# Patient Record
Sex: Female | Born: 1950 | Race: White | Hispanic: No | Marital: Married | Smoking: Former smoker
Health system: Southern US, Community
[De-identification: ages and names within clinical notes are randomized; demographics above are authoritative.]

## PROBLEM LIST (undated history)

## (undated) DIAGNOSIS — E669 Obesity, unspecified: Secondary | ICD-10-CM

## (undated) DIAGNOSIS — E785 Hyperlipidemia, unspecified: Secondary | ICD-10-CM

## (undated) DIAGNOSIS — G2581 Restless legs syndrome: Secondary | ICD-10-CM

## (undated) DIAGNOSIS — T7840XA Allergy, unspecified, initial encounter: Secondary | ICD-10-CM

## (undated) HISTORY — PX: ABDOMINAL HYSTERECTOMY: SHX81

## (undated) HISTORY — DX: Restless legs syndrome: G25.81

## (undated) HISTORY — DX: Allergy, unspecified, initial encounter: T78.40XA

## (undated) HISTORY — DX: Obesity, unspecified: E66.9

## (undated) HISTORY — DX: Hyperlipidemia, unspecified: E78.5

## (undated) HISTORY — PX: CHOLECYSTECTOMY: SHX55

---

## 2001-05-25 ENCOUNTER — Encounter: Payer: Self-pay | Admitting: Family Medicine

## 2001-05-25 ENCOUNTER — Encounter: Admission: RE | Admit: 2001-05-25 | Discharge: 2001-05-25 | Payer: Self-pay | Admitting: Family Medicine

## 2001-11-03 ENCOUNTER — Encounter: Payer: Self-pay | Admitting: General Surgery

## 2001-11-03 ENCOUNTER — Encounter (INDEPENDENT_AMBULATORY_CARE_PROVIDER_SITE_OTHER): Payer: Self-pay | Admitting: *Deleted

## 2001-11-03 ENCOUNTER — Ambulatory Visit (HOSPITAL_COMMUNITY): Admission: RE | Admit: 2001-11-03 | Discharge: 2001-11-04 | Payer: Self-pay | Admitting: General Surgery

## 2002-11-01 ENCOUNTER — Other Ambulatory Visit: Admission: RE | Admit: 2002-11-01 | Discharge: 2002-11-01 | Payer: Self-pay | Admitting: Family Medicine

## 2003-03-29 ENCOUNTER — Ambulatory Visit (HOSPITAL_COMMUNITY): Admission: RE | Admit: 2003-03-29 | Discharge: 2003-03-29 | Payer: Self-pay | Admitting: Gastroenterology

## 2003-04-03 ENCOUNTER — Ambulatory Visit (HOSPITAL_COMMUNITY): Admission: RE | Admit: 2003-04-03 | Discharge: 2003-04-03 | Payer: Self-pay | Admitting: Gastroenterology

## 2003-04-03 ENCOUNTER — Encounter: Payer: Self-pay | Admitting: Gastroenterology

## 2003-10-15 ENCOUNTER — Encounter: Admission: RE | Admit: 2003-10-15 | Discharge: 2003-10-15 | Payer: Self-pay | Admitting: Family Medicine

## 2005-05-25 ENCOUNTER — Encounter: Admission: RE | Admit: 2005-05-25 | Discharge: 2005-05-25 | Payer: Self-pay | Admitting: Family Medicine

## 2006-08-01 ENCOUNTER — Encounter: Admission: RE | Admit: 2006-08-01 | Discharge: 2006-08-01 | Payer: Self-pay | Admitting: Family Medicine

## 2007-07-25 ENCOUNTER — Encounter: Admission: RE | Admit: 2007-07-25 | Discharge: 2007-07-25 | Payer: Self-pay | Admitting: Family Medicine

## 2007-08-23 ENCOUNTER — Encounter (INDEPENDENT_AMBULATORY_CARE_PROVIDER_SITE_OTHER): Payer: Self-pay | Admitting: Obstetrics and Gynecology

## 2007-08-23 ENCOUNTER — Ambulatory Visit (HOSPITAL_COMMUNITY): Admission: RE | Admit: 2007-08-23 | Discharge: 2007-08-23 | Payer: Self-pay | Admitting: Obstetrics and Gynecology

## 2010-01-26 ENCOUNTER — Encounter: Admission: RE | Admit: 2010-01-26 | Discharge: 2010-01-26 | Payer: Self-pay | Admitting: Family Medicine

## 2010-03-31 ENCOUNTER — Encounter: Admission: RE | Admit: 2010-03-31 | Discharge: 2010-03-31 | Payer: Self-pay | Admitting: Family Medicine

## 2010-10-11 ENCOUNTER — Encounter: Payer: Self-pay | Admitting: Family Medicine

## 2011-02-02 NOTE — H&P (Signed)
Margaret Casey, KAUFMAN                ACCOUNT NO.:  [id number]   MEDICAL RECORD NO.:  [id number]          PATIENT TYPE:  AMB   LOCATION:  SDC                           [facility]:  [facility]   PHYSICIAN:  Lenoard Aden, M.D.DATE OF BIRTH:  April 16, 1951   DATE OF ADMISSION:  08/23/2007  DATE OF DISCHARGE:  08/23/2007                              HISTORY & PHYSICAL   CHIEF COMPLAINT:  Pelvic pain, ovarian cyst.   HISTORY:  She is a 60 year old white female G 3, P 3, status post  hysterectomy in 1989, who presents with new onset ovarian cyst and  persistent pelvic pain, for definitive therapy.  She had an ultrasound  performed on August 03, 2007, consistent with a 4 cm ovarian cyst in  the left ovary with septation noted.  She had a normal CA125, normal  right ovary, absent uterus.  She presents now for definitive therapy,  due to persistent pain and discomfort.   CURRENT MEDICATIONS:  1. Estradiol.  2. Singulair.   ALLERGIES:  LATEX, PREDNISONE AND PREMARIN, as previously reported.   FAMILY HISTORY:  Diabetes, heart disease and chronic hypertension.   PAST SURGICAL HISTORY:  1. Remarkable for a cholecystectomy in 2002.  2. Hysterectomy in 1989.   SOCIAL HISTORY:  She is a nonsmoker, nondrinker.  She denies significant  physical violence.   PHYSICAL EXAMINATION:  VITAL SIGNS:  Blood pressure 114/72, weight 159  pounds.  HEENT:  Normal.  LUNGS:  Clear.  HEART:  A regular rhythm.  ABDOMEN:  Soft, nontender.  PELVIC EXAM:  Reveals an absent uterus and pelvic fullness noted, and  tenderness.  No nodularity in the cul-de-sac is noted.  EXTREMITIES:  No cords.  NEUROLOGIC:  Nonfocal.  SKIN:  Intact.   IMPRESSION:  Septated ovarian cyst, for surgical therapy.   PLAN:  Proceed with a diagnostic laparoscopy and bilateral salpingo-  oophorectomy.  The risks of anesthesia, infection, bleeding and intra-  abdominal injury were discussed.  The numerous complications including  bowel and  bladder injury were noted, the inability to cure cancer were  discussed.  The patient acknowledges and wishes to proceed.      Lenoard Aden, M.D.  Electronically Signed     RJT/MEDQ  D:  08/22/2007  T:  08/23/2007  Job:  [id number]

## 2011-02-02 NOTE — Op Note (Signed)
NAMECECI, TALIAFERRO NO.:  [id number]   MEDICAL RECORD NO.:  [id number]          PATIENT TYPE:  INP   LOCATION:  9312                          [facility]ILITY:  [facility]   PHYSICIAN:  Lenoard Aden, M.D.DATE OF BIRTH:  11/08/1950   DATE OF PROCEDURE:  08/23/2007  DATE OF DISCHARGE:                               OPERATIVE REPORT   PREOPERATIVE DIAGNOSES:  1. Pelvic pain.  2. Left ovarian cyst.   POSTOPERATIVE DIAGNOSES:  1. Left paratubal cyst.  2. Pelvic adhesions.   PROCEDURES:  1. Diagnostic laparoscopy.  2. Bilateral salpingo-oophorectomy.  3. Lysis of adhesions.   SURGEON:  Lenoard Aden, M.D.   ASSISTANT:  Chester Holstein. Earlene Plater, M.D.   ANESTHESIA:  General.   ESTIMATED BLOOD LOSS:  Less than 50 mL.   COMPLICATIONS:  None.   DRAINS:  Foley.   COUNTS:  Correct.   Patient to recovery in good condition.   SPECIMENS:  Left paratubal cyst, left ovary and left tube, right tube  and ovary to pathology.   BRIEF OPERATIVE NOTE:  After being apprised of the risks of anesthesia,  infection, bleeding, injury to abdominal organs and need for repair,  delayed versus complications to include bowel and bladder injury, the  patient was brought to the operating.  She was administered a general  anesthetic without complications, prepped and draped in the usual  fashion.  She was placed in the Milton stirrups, a Foley catheter placed,  a sponge forceps placed per vagina.  Infraumbilical incision made with a  scalpel and the fascia identified.  Fascia opened transversely,  peritoneum identified and entered sharply using Metzenbaum scissors.  Atraumatic entry noted.  A pursestring suture placed in the fascia using  0 Vicryl suture.  Hasson trocar placed.  Visualization reveals normal  liver and gallbladder bed, normal diaphragmatic area.  There is an  absent uterus, adhesions in the cul-de-sac.  The left paratubal cyst and  ovary are adhesed to the left pelvic  sidewall.  Right tube and ovary  appear otherwise normal and are adhered to the left sigmoid mesentery.  At this time to 5-mm trocar sites are placed bilaterally under direct  visualization and Endoshears are placed and the Gyrus is used to excise  the left paratubal cyst from the pelvic sidewall without difficulty.  It  is placed in the cul-de-sac intact.  The left infundibulopelvic ligament  is then identified after identifying the left ureter and is cauterized  using the gyrus after lysing adhesions to the pelvic sidewall.  Using  sharp dissection, the left ovary is then removed intact and placed in  the cul-de-sac.  The cul-de-sac adhesions are then lysed sharply using  Endoshears and sharp dissection in the cul-de-sac after identifying the  course of the bowel throughout the entire process.  At this time the  adhesions having been removed and right ureter is identified, the right  infundibulopelvic ligament is identified, grasped and ligated using the  Gyrus with progressive bites down the right mesosalpingeal area is used  to remove the entire right ovarian-tubal complex.  Good hemostasis  noted.  The 5-mm camera is placed, an EndoCatch is placed, and all  specimens are placed in the EndoCatch and removed through the abdominal  incision.  At this time good is hemostasis noted, irrigation was  accomplished and the ureters appear normal bilaterally.  At this time  all instruments are removed under direct visualization, CO2 was  released.  The incision is closed using 0 Vicryl and Dermabond.  Instruments are removed from the vagina.  The patient tolerates the  procedure well and is transferred to recovery in good condition.      Lenoard Aden, M.D.  Electronically Signed     RJT/MEDQ  D:  08/23/2007  T:  08/24/2007  Job:  [id number]

## 2011-02-05 NOTE — Op Note (Signed)
   NAME:  Margaret Casey, Margaret Casey                          ACCOUNT NO.:  [id number]   MEDICAL RECORD NO.:  [id number]                   PATIENT TYPE:  AMB   LOCATION:  ENDO                                 FACILITY:  [facility]   PHYSICIAN:  Anselmo Rod, M.D.               DATE OF BIRTH:  [birth year]   DATE OF PROCEDURE:  03/29/2003  DATE OF DISCHARGE:                                 OPERATIVE REPORT   PROCEDURE:  Colonoscopy.   ENDOSCOPIST:  Anselmo Rod, M.D.   INSTRUMENT USED:  Olympus video colonoscope.   INDICATIONS FOR PROCEDURE:  A 60 year old white female with a history of  rectal bleeding rule out colonic polyps, masses, hemorrhoids, etc.   PREPROCEDURE PREPARATION:  Informed consent was obtained from the patient  and the patient was  fasted for 8 hours prior to  the procedure and prepped  with a bottle of magnesium citrate and a gallon of GoLYTELY the night prior  to the procedure.   PREPROCEDURE PHYSICAL:  The patient had stable vital signs. Neck supple.  Chest clear to auscultation, normal S1, S2. Abdomen soft with normoactive  bowel sounds.   DESCRIPTION OF PROCEDURE:  The patient was placed in the left lateral  decubitus position and sedated with an additional 10 mg of  Demerol and 1 mg  of Versed intravenously. Once sedation was adequate, the patient was  maintained on low flow oxygen and continuous cardiac monitoring.   The Olympus video colonoscope was advanced into the rectum to the cecum  without difficulty. There was some residual  stool  in the colon. Multiple  washings were done. No masses, polyps, erosions or ulcerations or  diverticula were seen.  The appendiceal orifice and ileocecal valve were  clearly visualized and photographed. No source of bleeding could be  identified.  Retroflexion in the rectum revealed no abnormalities as well.   IMPRESSION:  Normal colonoscopy.   RECOMMENDATIONS:  1. A high fiber diet with liberal fluid intake has been  recommended.  2.     Avoidance of all nonsteroidals including aspirin has been encouraged.  3. A small bowel follow through will be done to complete the patient's GI     evaluation. Further  recommendations will be made as needed.                                               Anselmo Rod, M.D.    JNM/MEDQ  D:  03/29/2003  T:  03/30/2003  Job:  [id number]   cc:   Talmadge Coventry, M.D.  [address]  [place]  [place] [address]  Fax: [phone]

## 2011-02-05 NOTE — Op Note (Signed)
   NAME:  Margaret Casey, Margaret Casey                          ACCOUNT NO.:  [id number]   MEDICAL RECORD NO.:  [id number]                   PATIENT TYPE:  AMB   LOCATION:  ENDO                                 FACILITY:  [facility]   PHYSICIAN:  Anselmo Rod, M.D.               DATE OF BIRTH:  10/25/1950   DATE OF PROCEDURE:  03/29/2003  DATE OF DISCHARGE:                                 OPERATIVE REPORT   PROCEDURE:  Esophagogastroduodenoscopy.   ENDOSCOPIST:  Anselmo Rod, M.D.   INSTRUMENT USED:  Olympus video panendoscope.   INDICATIONS FOR PROCEDURE:  Question melena in a 60 year old white female  with a history of  melena with bright red blood in the stool. Rule out  peptic ulcer disease, esophagitis, gastritis, etc.   PREPROCEDURE PREPARATION:  Informed consent was obtained from the patient.  The patient was  fasted for 8 hours prior to  the procedure.   PREPROCEDURE PHYSICAL:  The patient had stable vital signs, neck supple,  chest clear to auscultation, S1, S2, regular, abdomen soft with normal bowel  sounds.   DESCRIPTION OF PROCEDURE:  The patient was placed in the left lateral  decubitus position and sedated with 90 mg of Demerol, and 9 mg of Versed  intravenously. Once the patient was adequately sedated and maintained on low  flow oxygen and continuous cardiac monitoring, the Olympus video  panendoscope was advanced through the mouth piece over the tongue into the  esophagus under direct visualization.   The entire esophagus appeared normal with no evidence of rings, stricture,  masses, esophagitis or Barrett's mucosa. The scope was then advanced into  the stomach. There was mild diffuse gastritis noted throughout the gastric  mucosa. No ulcerations, masses or polyps were seen. The proximal small bowel  appeared  normal. There was no obvious obstruction. The patient tolerated  the procedure well without complications. No source of bleeding could be  identified.   IMPRESSION:   Mild diffuse gastritis, otherwise normal  esophagogastroduodenoscopy, no ulcers, erosions, masses or polyps seen.   RECOMMENDATIONS:  1. Proceed with the colonoscopy at this time.  2.     Avoid nonsteroidals.  3. Continue proton pump inhibitors.  4. Further  recommendations will be made after the colonoscopy has been     done.                                               Anselmo Rod, M.D.    JNM/MEDQ  D:  03/29/2003  T:  03/30/2003  Job:  [id number]   cc:   Talmadge Coventry, M.D.  [address]  [place]  [place] [address]  Fax: [phone]

## 2011-02-05 NOTE — Op Note (Signed)
[facility]  Patient:    Margaret Casey, Margaret Casey Visit Number: [id number] MRN: [id number]          Service Type: DSU Location: [phone] Attending Physician:  Delsa Bern Dictated by:   Lorne Skeens. Hoxworth, M.D. Proc. Date: 11/03/01 Admit Date:  11/03/2001 Discharge Date: 11/04/2001                             Operative Report  PREOPERATIVE DIAGNOSIS:  Symptomatic cholelithiasis.  POSTOPERATIVE DIAGNOSIS:  Symptomatic cholelithiasis.  PROCEDURE:  Laparoscopic cholecystectomy.  SURGEON:  Lorne Skeens. Hoxworth, M.D.  ASSISTANT:  Sandria Bales. Ezzard Standing, M.D.  ANESTHESIA:  General.  BRIEF HISTORY:  Ms. Printup is a 60 year old white female with a two-week history of fairly persistent epigastric abdominal pain following an acute episode of severe pain.  She has had a workup including a gallbladder ultrasound showing multiple gallstones.  Tenderness in the epigastrium and right upper quadrant has been noted.  She is felt to have symptomatic cholelithiasis, and laparoscopic cholecystectomy has been recommended and accepted.  The nature of the procedure, its indications, and risks of bleeding, infection, bile duct injury were discussed and understood preoperatively.  She is now brought to the operating room for this procedure.  DESCRIPTION OF PROCEDURE:  The patient was brought to the operating room and placed in the supine position on the operating table, and general endotracheal anesthesia was induced.  The abdomen was sterilely prepped and draped.  PAS were in place.  She received preoperative antibiotics.  Local anesthesia was used to infiltrate the trocar sites.  A 1 cm incision was made in the umbilicus and dissection carried down to the midline fascia, which was sharply incised for 1 cm and the peritoneum entered under direct vision.  Through a mattress suture of 0 Vicryl, the Hasson trocar was placed and pneumoperitoneum established.   Under direct vision a 10 mm trocar was placed in the subxiphoid area and two 5 mm trocars in the right subcostal margin.  The gallbladder was somewhat distended and slightly thickened.  The fundus was grasped and elevated up over the liver and the infundibulum retracted inferolaterally. The peritoneum on the anterior and posterior side of the distal gallbladder was incised, and fibrofatty tissue was stripped off the neck of the gallbladder toward the porta hepatis.  The distal gallbladder and Calots triangle was thoroughly dissected.  The cystic duct was identified and dissected 360 degrees.  The anatomy was clear.  The common bile duct was normal by ultrasound, and LFTs were normal.  I elected not to perform a cholangiogram.  The cystic duct and cystic artery were clearly visualized, were then doubly clipped proximally, clipped distally, and divided.  The gallbladder was then dissected free from its bed using hook cautery.  A couple of small accessory vessels in the gallbladder bed were clipped.  The gallbladder was then removed intact through the umbilicus.  There was no bleeding.  We carefully inspected the operative site.  Trocars were removed and all CO2 evacuated from the peritoneal cavity.  The mattress suture was secured to the umbilicus.  The skin incisions were closed with interrupted subcuticular 4-0 Monocryl and Steri-Strips.  The sponge, instrument, and needle counts were correct.  Dry sterile dressings were applied, and the patient was taken to recovery in good condition. Dictated by:   Lorne Skeens. Hoxworth, M.D. Attending Physician:  Delsa Bern DD:  11/03/01 TD:  11/04/01 Job: [id number]

## 2011-06-28 LAB — CBC
HCT: 42.9
MCHC: 34.5
MCV: 91.1
Platelets: 298
WBC: 5.9

## 2012-01-18 ENCOUNTER — Encounter: Payer: Self-pay | Admitting: *Deleted

## 2012-01-18 DIAGNOSIS — G2581 Restless legs syndrome: Secondary | ICD-10-CM | POA: Insufficient documentation

## 2012-01-18 DIAGNOSIS — E669 Obesity, unspecified: Secondary | ICD-10-CM | POA: Insufficient documentation

## 2012-01-18 DIAGNOSIS — M109 Gout, unspecified: Secondary | ICD-10-CM | POA: Insufficient documentation

## 2012-02-26 ENCOUNTER — Ambulatory Visit (INDEPENDENT_AMBULATORY_CARE_PROVIDER_SITE_OTHER): Payer: BC Managed Care – PPO | Admitting: Internal Medicine

## 2012-02-26 VITALS — BP 121/75 | HR 66 | Temp 98.0°F | Resp 16 | Ht 64.0 in | Wt 199.4 lb

## 2012-02-26 DIAGNOSIS — J019 Acute sinusitis, unspecified: Secondary | ICD-10-CM

## 2012-02-26 DIAGNOSIS — J301 Allergic rhinitis due to pollen: Secondary | ICD-10-CM

## 2012-02-26 MED ORDER — LORATADINE 10 MG PO CAPS: 10.0000 mg | ORAL_CAPSULE | ORAL | Status: DC

## 2012-02-26 MED ORDER — AZELASTINE HCL 0.15 % NA SOLN: 2.0000 | Freq: Two times a day (BID) | NASAL | Status: DC

## 2012-02-26 MED ORDER — AMOXICILLIN 500 MG PO CAPS: 1000.0000 mg | ORAL_CAPSULE | Freq: Two times a day (BID) | ORAL | Status: AC

## 2012-02-26 NOTE — Progress Notes (Signed)
  Subjective:    Patient ID: Margaret Casey, female    DOB: [birth year], 61 y.o.   MRN: [id number]  HPIYear-round allergies to mold Seasonal allergies to pollen Now with symptoms for 6-8 weeks/OTC meds not working 2 weeks of purulent sputum with slight cough but only during the daytime Sinus pressure    Review of Systems Patient Active Problem List  Diagnoses  . Gout  . Obesity  . Restless leg syndrome  Stable problems      Objective:   Physical Exam Conjunctiva slightly injected Nares boggy with purulent mucus Throat clear/no lymph node swelling Chest clear to auscultation       Assessment & Plan:  Problem #1 allergic rhinitis Problem #2 sinusitis secondary Meds ordered this encounter  Medications  . Loratadine 10 MG CAPS    Sig: Take 1 capsule (10 mg total) by mouth 1 day or 1 dose.    Dispense:  30 each    Refill:  11  . amoxicillin (AMOXIL) 500 MG capsule    Sig: Take 2 capsules (1,000 mg total) by mouth 2 (two) times daily.    Dispense:  40 capsule    Refill:  0  . Azelastine HCl 0.15 % SOLN    Sig: Place 2 sprays into the nose 2 (two) times daily.    Dispense:  30 mL    Refill:  11  Followup 10 days if not well Continue Nose spray as prophylaxis

## 2012-04-05 ENCOUNTER — Other Ambulatory Visit: Payer: Self-pay | Admitting: Physician Assistant

## 2012-04-10 ENCOUNTER — Other Ambulatory Visit: Payer: Self-pay | Admitting: Physician Assistant

## 2012-04-11 ENCOUNTER — Other Ambulatory Visit: Payer: Self-pay | Admitting: Physician Assistant

## 2012-04-11 NOTE — Telephone Encounter (Signed)
Need chart to nurses station. 

## 2012-07-10 ENCOUNTER — Other Ambulatory Visit: Payer: Self-pay | Admitting: Physician Assistant

## 2012-07-17 ENCOUNTER — Ambulatory Visit (INDEPENDENT_AMBULATORY_CARE_PROVIDER_SITE_OTHER): Payer: BC Managed Care – PPO | Admitting: Emergency Medicine

## 2012-07-17 VITALS — BP 156/82 | HR 68 | Temp 97.7°F | Resp 16 | Ht 64.0 in | Wt 209.0 lb

## 2012-07-17 DIAGNOSIS — G2581 Restless legs syndrome: Secondary | ICD-10-CM

## 2012-07-17 MED ORDER — ROPINIROLE HCL 2 MG PO TABS: 2.0000 mg | ORAL_TABLET | Freq: Every day | ORAL | Status: DC

## 2012-07-17 NOTE — Progress Notes (Signed)
[facility] [address], [place] [address] [phone]  Date:  07/17/2012   Name:  Margaret Casey   DOB:  [birth year]   MRN:  [id number]  PCP:  No primary provider on file.    Chief Complaint: Medication Refill   History of Present Illness:  Margaret Casey is a 61 y.o. very pleasant female patient who presents with the following:  Restless leg syndrome and has increased symptoms and has lack of suitable control after 0400.  No other complaints.  Allergy symptoms improved with treatment with allegra  Patient Active Problem List  Diagnosis  . Gout  . Obesity  . Restless leg syndrome    Past Medical History  Diagnosis Date  . Hyperlipidemia   . Gout   . Obesity   . Restless leg syndrome   . Allergy     Past Surgical History  Procedure Date  . Cholecystectomy   . Abdominal hysterectomy     History  Substance Use Topics  . Smoking status: Former Games developer  . Smokeless tobacco: Not on file  . Alcohol Use: No    Family History  Problem Relation Age of Onset  . Heart disease Father   . Hypertension Father     Allergies  Allergen Reactions  . Hydrocodone   . Prednisone     Medication list has been reviewed and updated.  Current Outpatient Prescriptions on File Prior to Visit  Medication Sig Dispense Refill  . Ascorbic Acid (VITAMIN C) 100 MG tablet Take 100 mg by mouth daily.      Marland Kitchen aspirin 81 MG tablet Take 81 mg by mouth daily.      . Azelastine HCl 0.15 % SOLN Place 2 sprays into the nose 2 (two) times daily.  30 mL  11  . calcium carbonate 1250 MG capsule Take 1,250 mg by mouth 2 (two) times daily with a meal.      . Cholecalciferol (VITAMIN D3) 2000 UNITS TABS Take by mouth.      . fish oil-omega-3 fatty acids 1000 MG capsule Take 2 g by mouth daily.      Marland Kitchen glucosamine-chondroitin 500-400 MG tablet Take 1 tablet by mouth 3 (three) times daily.      . Loratadine 10 MG CAPS Take 1 capsule (10 mg total) by mouth 1 day or 1 dose.   30 each  11  . Melatonin 3 MG CAPS Take by mouth.      . naproxen (NAPROSYN) 500 MG tablet Take 500 mg by mouth 2 (two) times daily with a meal.      . rOPINIRole (REQUIP) 1 MG tablet take 1 tablet by mouth at bedtime  90 tablet  0  . thiamine (VITAMIN B-1) 100 MG tablet Take 100 mg by mouth daily.      . vitamin E 400 UNIT capsule Take 400 Units by mouth daily.      Marland Kitchen ALPRAZolam (XANAX) 0.25 MG tablet Take 0.25 mg by mouth at bedtime as needed.      Marland Kitchen estradiol (ESTRACE) 2 MG tablet Take 2 mg by mouth daily.      Marland Kitchen rOPINIRole (REQUIP) 2 MG tablet Take 2 mg by mouth at bedtime.        Review of Systems:  As per HPI, otherwise negative.    Physical Examination: Filed Vitals:   07/17/12 1115  BP: 156/82  Pulse: 68  Temp: 97.7 F (36.5 C)  Resp: 16   Filed Vitals:  07/17/12 1115  Height: 5\' 4"  (1.626 m)  Weight: 209 lb (94.802 kg)   Body mass index is 35.87 kg/(m^2). Ideal Body Weight: Weight in (lb) to have BMI = 25: 145.3   GEN: WDWN, NAD, Non-toxic, A & O x 3 HEENT: Atraumatic, Normocephalic. Neck supple. No masses, No LAD. Ears and Nose: No external deformity. CV: RRR, No M/G/R. No JVD. No thrill. No extra heart sounds. PULM: CTA B, no wheezes, crackles, rhonchi. No retractions. No resp. distress. No accessory muscle use. ABD: S, NT, ND, +BS. No rebound. No HSM. EXTR: No c/c/e NEURO Normal gait.  PSYCH: Normally interactive. Conversant. Not depressed or anxious appearing.  Calm demeanor.    Assessment and Plan: Restless leg  Carmelina Dane, MD

## 2012-07-24 NOTE — Progress Notes (Signed)
Reviewed and agree.

## 2012-12-04 ENCOUNTER — Encounter: Payer: Self-pay | Admitting: Internal Medicine

## 2012-12-21 ENCOUNTER — Other Ambulatory Visit: Payer: Self-pay | Admitting: Emergency Medicine

## 2013-01-25 ENCOUNTER — Other Ambulatory Visit: Payer: Self-pay | Admitting: Emergency Medicine

## 2013-02-23 ENCOUNTER — Other Ambulatory Visit: Payer: Self-pay | Admitting: Physician Assistant

## 2013-02-24 ENCOUNTER — Ambulatory Visit (INDEPENDENT_AMBULATORY_CARE_PROVIDER_SITE_OTHER): Payer: BC Managed Care – PPO | Admitting: Family Medicine

## 2013-02-24 VITALS — BP 141/75 | HR 63 | Temp 98.0°F | Resp 16 | Ht 64.0 in | Wt 198.4 lb

## 2013-02-24 DIAGNOSIS — G2581 Restless legs syndrome: Secondary | ICD-10-CM

## 2013-02-24 MED ORDER — ROPINIROLE HCL 2 MG PO TABS: ORAL_TABLET | ORAL | Status: DC

## 2013-02-24 NOTE — Progress Notes (Signed)
[facility] [address] [place] [address] [phone]  Date:  02/24/2013   Name:  Margaret Casey   DOB:  1951/09/06   MRN:  [id number]  PCP:  No primary provider on file.    Chief Complaint: Medication Problem   History of Present Illness:  Margaret Casey is a 62 y.o. very pleasant female patient who presents with the following:  She is here for a refill of her requip today- she takes this for RLS.  Has done so for about 4 years with good success.  She has tried to stop taking this but has recurrence of her symptoms.  Otherwise she is feeling well and does not have any other concerns today.  She is up to 2 mg which works well for her  Patient Active Problem List   Diagnosis Date Noted  . Gout   . Obesity   . Restless leg syndrome     Past Medical History  Diagnosis Date  . Hyperlipidemia   . Gout   . Obesity   . Restless leg syndrome   . Allergy     Past Surgical History  Procedure Laterality Date  . Cholecystectomy    . Abdominal hysterectomy      History  Substance Use Topics  . Smoking status: Former Games developer  . Smokeless tobacco: Not on file  . Alcohol Use: No    Family History  Problem Relation Age of Onset  . Heart disease Father   . Hypertension Father     Allergies  Allergen Reactions  . Hydrocodone   . Prednisone     Medication list has been reviewed and updated.  Current Outpatient Prescriptions on File Prior to Visit  Medication Sig Dispense Refill  . ALPRAZolam (XANAX) 0.25 MG tablet Take 0.25 mg by mouth at bedtime as needed.      . Ascorbic Acid (VITAMIN C) 100 MG tablet Take 100 mg by mouth daily.      Marland Kitchen aspirin 81 MG tablet Take 81 mg by mouth daily.      . Cholecalciferol (VITAMIN D3) 2000 UNITS TABS Take by mouth.      . fish oil-omega-3 fatty acids 1000 MG capsule Take 2 g by mouth daily.      Marland Kitchen glucosamine-chondroitin 500-400 MG tablet Take 1 tablet by mouth 3 (three) times daily.      . Melatonin 3 MG  CAPS Take by mouth.      Marland Kitchen rOPINIRole (REQUIP) 2 MG tablet TAKE 1 TABLET BY MOUTH AT BEDTIME  15 tablet  0  . thiamine (VITAMIN B-1) 100 MG tablet Take 100 mg by mouth daily.      . vitamin E 400 UNIT capsule Take 400 Units by mouth daily.      . Azelastine HCl 0.15 % SOLN Place 2 sprays into the nose 2 (two) times daily.  30 mL  11  . calcium carbonate 1250 MG capsule Take 1,250 mg by mouth 2 (two) times daily with a meal.      . estradiol (ESTRACE) 2 MG tablet Take 2 mg by mouth daily.      . Loratadine 10 MG CAPS Take 1 capsule (10 mg total) by mouth 1 day or 1 dose.  30 each  11  . naproxen (NAPROSYN) 500 MG tablet Take 500 mg by mouth 2 (two) times daily with a meal.       No current facility-administered medications on file prior to visit.  Review of Systems:  As per HPI- otherwise negative.   Physical Examination: Filed Vitals:   02/24/13 0834  BP: 141/75  Pulse: 63  Temp: 98 F (36.7 C)  Resp: 16   Filed Vitals:   02/24/13 0834  Height: 5\' 4"  (1.626 m)  Weight: 198 lb 6.4 oz (89.994 kg)   Body mass index is 34.04 kg/(m^2). Ideal Body Weight: Weight in (lb) to have BMI = 25: 145.3  GEN: WDWN, NAD, Non-toxic, A & O x 3 HEENT: Atraumatic, Normocephalic. Neck supple. No masses, No LAD. Ears and Nose: No external deformity. CV: RRR, No M/G/R. No JVD. No thrill. No extra heart sounds. PULM: CTA B, no wheezes, crackles, rhonchi. No retractions. No resp. distress. No accessory muscle use. EXTR: No c/c/e NEURO Normal gait.  PSYCH: Normally interactive. Conversant. Not depressed or anxious appearing.  Calm demeanor.  No calf tenderness or swelling   Assessment and Plan: Restless legs syndrome (RLS) - Plan: rOPINIRole (REQUIP) 2 MG tablet  Continue 2mg  of requip for RLS. Follow- up prn  Signed Abbe Amsterdam, MD

## 2013-02-24 NOTE — Patient Instructions (Addendum)
Take care- good to see you today!  

## 2013-04-11 DIAGNOSIS — Z8542 Personal history of malignant neoplasm of other parts of uterus: Secondary | ICD-10-CM | POA: Insufficient documentation

## 2013-04-11 DIAGNOSIS — M129 Arthropathy, unspecified: Secondary | ICD-10-CM | POA: Insufficient documentation

## 2013-04-12 DIAGNOSIS — E785 Hyperlipidemia, unspecified: Secondary | ICD-10-CM | POA: Insufficient documentation

## 2013-05-11 ENCOUNTER — Ambulatory Visit (INDEPENDENT_AMBULATORY_CARE_PROVIDER_SITE_OTHER): Payer: BC Managed Care – PPO | Admitting: Neurology

## 2013-05-11 ENCOUNTER — Encounter: Payer: Self-pay | Admitting: Neurology

## 2013-05-11 ENCOUNTER — Institutional Professional Consult (permissible substitution): Payer: Self-pay | Admitting: Neurology

## 2013-05-11 VITALS — BP 131/71 | HR 71 | Ht 63.0 in | Wt 198.0 lb

## 2013-05-11 DIAGNOSIS — G4733 Obstructive sleep apnea (adult) (pediatric): Secondary | ICD-10-CM

## 2013-05-11 DIAGNOSIS — G2581 Restless legs syndrome: Secondary | ICD-10-CM

## 2013-05-11 NOTE — Patient Instructions (Signed)
Based on your symptoms and your exam I believe you are at risk for obstructive sleep apnea or OSA, and I think we should proceed with a sleep study to determine whether you do or do not have OSA and how severe it is. If you have more than mild OSA, I want you to consider treatment with CPAP. Please remember, the risks and ramifications of moderate to severe obstructive sleep apnea or OSA are: Cardiovascular disease, including congestive heart failure, stroke, difficult to control hypertension, arrhythmias, and even type 2 diabetes has been linked to untreated OSA. Sleep apnea causes disruption of sleep and sleep deprivation in most cases, which, in turn, can cause recurrent headaches, problems with memory, mood, concentration, focus, and vigilance. Most people with untreated sleep apnea report excessive daytime sleepiness, which can affect their ability to drive. Please do not drive if you feel sleepy.  I will see you back after your sleep study to go over the test results and where to go from there. We will call you after your sleep study and to set up an appointment at the time.   Please remember to try to maintain good sleep hygiene, which means: Keep a regular sleep and wake schedule, try not to exercise or have a meal within 2 hours of your bedtime, try to keep your bedroom conducive for sleep, that is, cool and dark, without light distractors such as an illuminated alarm clock, and refrain from watching TV right before sleep or in the middle of the night and do not keep the TV or radio on during the night. Also, try not to use or play on electronic devices at bedtime, such as your cell phone, tablet PC or laptop. If you like to read at bedtime on an electronic device, try to dim the background light as much as possible.  

## 2013-05-11 NOTE — Progress Notes (Signed)
Subjective:    Patient ID: Margaret Casey is a 62 y.o. female.  HPI  Huston Foley, MD, PhD [facility] [address], [address] [address] [place], [place] [address]  Dear Dr. Everlene Other,   I saw your patient, Margaret Casey, upon your kind request in my neurologic clinic today for initial consultation of her sleep disorder, in particular concern for obstructive sleep apnea and her restless leg syndrome. The patient is unaccompanied today. As you know, Ms. Margaret Casey is a very pleasant 62 year old right-handed woman with an underlying medical history of obesity, RLS, hyperlipidemia, gout, arthritis, and prior history of uterine cancer, who has noted worsening snoring in the past several months. She has also had some weight gain and complains of nonrestorative sleep. She had a sleep study years ago when she was first diagnosed with RLS. Her current medications are Requip 3 mg, vitamin C, vitamin B, vitamin D, vitamin B complex, melatonin, fish oil, baby aspirin.  Her typical bedtime is reported to be around 9:30 PM and usual wake time is around 5 AM. Sleep onset typically occurs within a few minutes. She reports feeling poorly rested upon awakening. She wakes up on an average 3 times in the middle of the night and has to go to the bathroom 2 times on a typical night. She admits to rare morning headaches.  She reports excessive daytime somnolence (EDS) and Her Epworth Sleepiness Score (ESS) is 12/24 today. She has not fallen asleep while driving. The patient has not been taking a scheduled nap. If she every takes a nap, she will doze in the evening, especially if she had difficult night with RLS. She denies dreaming in a nap and denies feeling refreshed after a nap.  She has been known to snore for the past many years, worse in the past 6-7 months. Snoring is reportedly moderate, and associated with choking sounds and witnessed apneas. The patient admits to a sense of choking feeling. There  is no report of nighttime reflux, with no nighttime cough experienced. The patient has very little residual RLS symptoms since increasing her Requip to 3 mg earlier this month and is known to kick while asleep or before falling asleep. There is family history of RLS in her MGM. Her mother and MGM had tremors as well and the patient has had head tremors for years.  She is a restless sleeper and in the morning, the bed is quite disheveled.   She denies cataplexy, sleep paralysis, hypnagogic or hypnopompic hallucinations, or sleep attacks. She does not report any vivid dreams, nightmares, dream enactments, or parasomnias, such as sleep talking or sleep walking. The patient has had a sleep study several years ago but was not told that she had obstructive sleep apnea, of note, she was less heavy at the time.  She consumes 4 caffeinated beverages per day, usually in the form of coffee in the earlier part of the day.  Her bedroom is usually dark and cool. There is a TV in the bedroom and usually it is not on at night and she does not watch TV right before bed.   Her Past Medical History Is Significant For: Past Medical History  Diagnosis Date  . Hyperlipidemia   . Gout   . Obesity   . Restless leg syndrome   . Allergy     Her Past Surgical History Is Significant For: Past Surgical History  Procedure Laterality Date  . Cholecystectomy    . Abdominal hysterectomy  Her Family History Is Significant For: Family History  Problem Relation Age of Onset  . Heart disease Father   . Hypertension Father     Her Social History Is Significant For: History   Social History  . Marital Status: Married    Spouse Name: N/A    Number of Children: N/A  . Years of Education: N/A   Occupational History  . Bus Driver    Social History Main Topics  . Smoking status: Former Games developer  . Smokeless tobacco: None  . Alcohol Use: No  . Drug Use: No  . Sexual Activity: None   Other Topics Concern  .  None   Social History Narrative  . None    Her Allergies Are:  Allergies  Allergen Reactions  . Hydrocodone   . Prednisone   :   Her Current Medications Are:  Outpatient Encounter Prescriptions as of 05/11/2013  Medication Sig Dispense Refill  . Ascorbic Acid (VITAMIN C) 100 MG tablet Take 100 mg by mouth daily.      Marland Kitchen aspirin 81 MG tablet Take 81 mg by mouth daily.      . Azelastine HCl 0.15 % SOLN Place 2 sprays into the nose 2 (two) times daily.  30 mL  11  . Cholecalciferol (VITAMIN D3) 2000 UNITS TABS Take by mouth.      . fish oil-omega-3 fatty acids 1000 MG capsule Take 2 g by mouth daily.      . Loratadine 10 MG CAPS Take 1 capsule (10 mg total) by mouth 1 day or 1 dose.  30 each  11  . Melatonin 3 MG CAPS Take by mouth.      Marland Kitchen rOPINIRole (REQUIP) 2 MG tablet Take 3 mg by mouth at bedtime. TAKE 1 TABLET BY MOUTH AT BEDTIME      . thiamine (VITAMIN B-1) 100 MG tablet Take 100 mg by mouth daily.      . vitamin E 400 UNIT capsule Take 400 Units by mouth daily.      . [DISCONTINUED] rOPINIRole (REQUIP) 2 MG tablet TAKE 1 TABLET BY MOUTH AT BEDTIME  90 tablet  3  . [DISCONTINUED] calcium carbonate 1250 MG capsule Take 1,250 mg by mouth 2 (two) times daily with a meal.      . [DISCONTINUED] estradiol (ESTRACE) 2 MG tablet Take 2 mg by mouth daily.      . [DISCONTINUED] glucosamine-chondroitin 500-400 MG tablet Take 1 tablet by mouth 3 (three) times daily.      . [DISCONTINUED] naproxen (NAPROSYN) 500 MG tablet Take 500 mg by mouth 2 (two) times daily with a meal.       No facility-administered encounter medications on file as of 05/11/2013.    Review of Systems  Respiratory:       Snoring, Restless  Allergic/Immunologic: Positive for environmental allergies.    Objective:  Neurologic Exam  Physical Exam Physical Examination:   Filed Vitals:   05/11/13 1008  BP: 131/71  Pulse: 71    General Examination: The patient is a very pleasant 62 y.o. female in no acute  distress. She appears well-developed and well-nourished and very well groomed. She is overweight.   HEENT: Normocephalic, atraumatic, pupils are equal, round and reactive to light and accommodation. Extraocular tracking is good without limitation to gaze excursion or nystagmus noted. Normal smooth pursuit is noted. Hearing is grossly intact. Tympanic membranes are clear bilaterally. Face is symmetric with normal facial animation and normal facial sensation. Speech is clear with  no dysarthria noted. There is no hypophonia. There is no lip, or voice tremor, but she does have a no-no type head tremor. Neck is supple with full range of passive and active motion. There are no carotid bruits on auscultation. Oropharynx exam reveals: mild mouth dryness, adequate dental hygiene and moderate airway crowding, due to narrow airway entry, tonsillar size are 1+, and elevated uvula. Mallampati is class II. Tongue protrudes centrally and palate elevates symmetrically. Neck size is 14 1/8 inches.   Chest: Clear to auscultation without wheezing, rhonchi or crackles noted.  Heart: S1+S2+0, regular and normal without murmurs, rubs or gallops noted.   Abdomen: Soft, non-tender and non-distended with normal bowel sounds appreciated on auscultation.  Extremities: There is no pitting edema in the distal lower extremities bilaterally. Pedal pulses are intact.  Skin: Warm and dry without trophic changes noted. There are no varicose veins.  Musculoskeletal: exam reveals no obvious joint deformities, tenderness or joint swelling or erythema.   Neurologically:  Mental status: The patient is awake, alert and oriented in all 4 spheres. Her memory, attention, language and knowledge are appropriate. There is no aphasia, agnosia, apraxia or anomia. Speech is clear with normal prosody and enunciation. Thought process is linear. Mood is congruent and affect is normal.  Cranial nerves are as described above under HEENT exam. In  addition, shoulder shrug is normal with equal shoulder height noted. Motor exam: Normal bulk, strength and tone is noted. There is no drift, tremor or rebound. Romberg is negative. Reflexes are 2+ throughout. Toes are downgoing bilaterally. Fine motor skills are intact with normal finger taps, normal hand movements, normal rapid alternating patting, normal foot taps and normal foot agility.  Cerebellar testing shows no dysmetria or intention tremor on finger to nose testing. Heel to shin is unremarkable bilaterally. There is no truncal or gait ataxia.  Sensory exam is intact to light touch, pinprick, vibration, temperature sense and proprioception in the upper and lower extremities.  Gait, station and balance are unremarkable. No veering to one side is noted. No leaning to one side is noted. Posture is age-appropriate and stance is narrow based. No problems turning are noted. She turns en bloc. Tandem walk is unremarkable. Intact toe and heel stance is noted.               Assessment and Plan:   In summary, Curlie D Ferrufino is a very pleasant 62 y.o.-year old female with a history and physical exam concerning for obstructive sleep apnea (OSA). I had a long chat with the patient about my findings and the diagnosis, its prognosis and treatment options. We talked about medical treatments and non-pharmacological approaches. I explained in particular the risks and ramifications of untreated moderate to severe OSA, especially with respect to developing cardiovascular disease down the Road, including congestive heart failure, difficult to treat hypertension, cardiac arrhythmias, or stroke. Even type 2 diabetes has in part been linked to untreated OSA. We talked about trying to maintain a healthy lifestyle in general, as well as the importance of weight control. I encouraged the patient to eat healthy, exercise daily and keep well hydrated, to keep a scheduled bedtime and wake time routine, to not skip any meals and  eat healthy snacks in between meals.  I recommended the following at this time: sleep study with potential CPAP titration. We will also be looking out for PLMs. I explained the sleep test procedure to the patient and also outlined surgical and non-surgical treatment options of OSA  including the use of a dental custom-made appliance, upper airway surgery such as pillar implants, radiofrequency surgery, tongue base surgery, and UPPP. I also explained the CPAP treatment option to the patient, who indicated that she would be willing to try CPAP if the need arises. I explained the importance of being compliant with PAP treatment, not only for insurance purposes but primarily for the patient's long term health benefit. I answered all her questions today and the patient was in agreement. I would like to see her back after the sleep study is completed and encouraged her to call with any interim questions, concerns, problems or updates.   Thank you very much for allowing me to participate in the care of this nice patient. If I can be of any further assistance to you please do not hesitate to call me at [phone].  Sincerely,   Huston Foley, MD, PhD

## 2013-06-01 ENCOUNTER — Ambulatory Visit (INDEPENDENT_AMBULATORY_CARE_PROVIDER_SITE_OTHER): Payer: BC Managed Care – PPO

## 2013-06-01 DIAGNOSIS — R0609 Other forms of dyspnea: Secondary | ICD-10-CM

## 2013-06-01 DIAGNOSIS — G4736 Sleep related hypoventilation in conditions classified elsewhere: Secondary | ICD-10-CM

## 2013-06-01 DIAGNOSIS — G2581 Restless legs syndrome: Secondary | ICD-10-CM

## 2013-06-01 DIAGNOSIS — G4761 Periodic limb movement disorder: Secondary | ICD-10-CM

## 2013-06-01 DIAGNOSIS — G4733 Obstructive sleep apnea (adult) (pediatric): Secondary | ICD-10-CM

## 2013-06-13 ENCOUNTER — Telehealth: Payer: Self-pay | Admitting: Neurology

## 2013-06-13 DIAGNOSIS — R0683 Snoring: Secondary | ICD-10-CM

## 2013-06-13 DIAGNOSIS — E662 Morbid (severe) obesity with alveolar hypoventilation: Secondary | ICD-10-CM

## 2013-06-13 DIAGNOSIS — G4736 Sleep related hypoventilation in conditions classified elsewhere: Secondary | ICD-10-CM

## 2013-06-13 DIAGNOSIS — G478 Other sleep disorders: Secondary | ICD-10-CM

## 2013-06-13 NOTE — Telephone Encounter (Signed)
Please call and notify the patient that the recent sleep study did not show any significant obstructive sleep apnea. However, while she had an overall normal AHI, she had evidence of intermittent mild to moderate snoring with some upper airway resistance and evidence of sleep related hypoxemia, meaning, that her average sleep O2 was below normal and stayed below 88% for over 3 hours. She is advised to seek consultation with a pulmonologist to look for lung pathology to account for her low oxygen values. I have placed an order in that regard in her chart. Please inform patient that I would like to go over the details of the study during a follow up appointment and if not already previously scheduled, arrange a followup appointment (please utilize a followu-up slot). Also, route or fax report to PCP and referring MD, if other than PCP.  Once you have spoken to patient, you can close this encounter.   Thanks,  Huston Foley, MD, PhD [facility])

## 2013-06-14 NOTE — Telephone Encounter (Signed)
Called patient to discuss sleep study results.  Discussed findings, recommendations and follow up care.  Patient understood well and all questions were answered.   Scheduled follow up appt for 06/20/13 at 10:30 AM.  Patient also made aware of pulmonary referral.  She will be interested to know where this was sent and who will be calling her. Copy of report mailed to patient at home and faxed to Dr. Everlene Other. -sh

## 2013-06-20 ENCOUNTER — Encounter: Payer: Self-pay | Admitting: Neurology

## 2013-06-20 ENCOUNTER — Ambulatory Visit (INDEPENDENT_AMBULATORY_CARE_PROVIDER_SITE_OTHER): Payer: BC Managed Care – PPO | Admitting: Neurology

## 2013-06-20 VITALS — BP 108/50 | HR 67 | Temp 97.7°F | Ht 63.0 in | Wt 196.0 lb

## 2013-06-20 DIAGNOSIS — R0683 Snoring: Secondary | ICD-10-CM

## 2013-06-20 DIAGNOSIS — E662 Morbid (severe) obesity with alveolar hypoventilation: Secondary | ICD-10-CM

## 2013-06-20 DIAGNOSIS — G2581 Restless legs syndrome: Secondary | ICD-10-CM

## 2013-06-20 DIAGNOSIS — R0609 Other forms of dyspnea: Secondary | ICD-10-CM

## 2013-06-20 DIAGNOSIS — G4736 Sleep related hypoventilation in conditions classified elsewhere: Secondary | ICD-10-CM

## 2013-06-20 DIAGNOSIS — G478 Other sleep disorders: Secondary | ICD-10-CM

## 2013-06-20 DIAGNOSIS — R0989 Other specified symptoms and signs involving the circulatory and respiratory systems: Secondary | ICD-10-CM

## 2013-06-20 NOTE — Progress Notes (Signed)
Subjective:    Patient ID: Margaret Casey is a 62 y.o. female.  HPI  Interim history:   Margaret Casey is a very pleasant 62 year old right-handed woman with an underlying medical history of obesity, RLS on Requip at 3 mg q HS, hyperlipidemia, gout, arthritis, and prior history of uterine cancer, who presents for followup consultation after a recent sleep study. I first met her on 05/11/2013, at which time she reported weight gain since her last sleep study and nonrestorative sleep as well as worsening snoring in the past several months. She had baseline sleep study on 06/01/2013 and I went over her test results with her in detail today. Sleep efficiency was reduced at 74.7% with a latency to sleep normal at 13 minutes but wake after sleep onset of 92 minutes with moderate sleep fragmentation noted. The arousal index was normal at 6.4 arousals per hour, due primarily to respiratory effort. She had an increased percentage of stage I and stage II sleep at 14.3 and 63% respectively, and decreased percentage of slow-wave sleep at 11.3%, and a decreased percentage of REM sleep at 11.4% with a highly prolonged REM latency of 360 minutes. She had no significant period leg movements. She had no significant cardiac arrhythmias. She had intermittent mild to moderate snoring. She had a total of 9 obstructive hypopneas, rendering an overall normal AHI of 1.7 per hour. Her RDI was 5.4 per hour. Her baseline oxygen saturation was noted to be only 88% and her nadir was 85%. She spent 5 hours and 5 minutes below the saturation of 90% and time below 88% saturation was 3 hours and 44 minutes. Based on the findings and her sleep related hypoxemia I suggested consultation with a pulmonologist and put a referral in. She was scheduled with Dr. Sherene Sires on 06/21/2013, but cancelled it as she feels, her oxygen problem is weight related. She has a hx of smoking and quit 5 years ago and had pneumonia 3 years ago, treated with oral ABx. 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 She has started a low calorie diet and has been exercising more and in fact, has lost about 3 lb in the last 5 days.   Her Past Medical History Is Significant For: Past Medical History  Diagnosis Date  . Hyperlipidemia   . Gout   . Obesity   . Restless leg syndrome   . Allergy    Her Past Surgical History Is Significant For: Past Surgical History  Procedure Laterality Date  . Cholecystectomy    . Abdominal hysterectomy     Her Family History Is Significant For: Family History  Problem Relation Age of Onset  . Heart disease Father   . Hypertension Father    Her Social History Is Significant For: History   Social History  . Marital Status: Married    Spouse Name: N/A    Number of Children: N/A  . Years of Education: N/A   Occupational History  . Bus Driver    Social History Main Topics  . Smoking status: Former Games developer  . Smokeless tobacco: None  . Alcohol Use: No  . Drug Use: No  . Sexual Activity: None   Other Topics Concern  . None   Social History Narrative  . None   Her Allergies Are:  Allergies  Allergen Reactions  . Hydrocodone   . Prednisone   :  Her Current Medications Are:  Outpatient Encounter Prescriptions as of 06/20/2013  Medication Sig Dispense Refill  . Ascorbic Acid (VITAMIN C) 100 MG tablet Take 100 mg by mouth daily.      Marland Kitchen aspirin 81 MG tablet Take 81 mg by mouth daily.      . Azelastine HCl 0.15 % SOLN Place 2 sprays into the nose 2 (two) times daily.  30 mL  11  . Cholecalciferol (VITAMIN D3) 2000 UNITS TABS Take by mouth.      . fish oil-omega-3 fatty acids 1000 MG capsule Take 2 g by mouth daily.      . Loratadine 10 MG CAPS Take 1 capsule (10 mg total) by mouth 1 day or 1 dose.  30 each  11  . Melatonin 3 MG CAPS Take by mouth.      Marland Kitchen rOPINIRole (REQUIP) 2 MG tablet Take 3 mg by mouth at bedtime. TAKE 1 TABLET BY MOUTH AT BEDTIME      . thiamine (VITAMIN B-1) 100  MG tablet Take 100 mg by mouth daily.      . vitamin E 400 UNIT capsule Take 400 Units by mouth daily.       No facility-administered encounter medications on file as of 06/20/2013.  : Review of Systems  HENT: Positive for hearing loss.   Respiratory:       Snoring  Psychiatric/Behavioral: Positive for sleep disturbance.   Objective:  Neurologic Exam  Physical Exam Physical Examination:   Filed Vitals:   06/20/13 1031  BP: 108/50  Pulse: 67  Temp: 97.7 F (36.5 C)    General Examination: The patient is a very pleasant 62 y.o. female in no acute distress. She appears well-developed and well-nourished and very well groomed. She is overweight.   HEENT: Normocephalic, atraumatic, pupils are equal, round and reactive to light and accommodation. Extraocular tracking is good without limitation to gaze excursion or nystagmus noted. Normal smooth pursuit is noted. Hearing is grossly intact. Face is symmetric with normal facial animation and normal facial sensation. Speech is clear with no dysarthria noted. There is no hypophonia. There is no lip, or voice tremor, but she does have a no-no type head tremor. Neck is supple with full range of passive and active motion. There are no carotid bruits on auscultation. Oropharynx exam reveals: mild mouth dryness, adequate dental hygiene and moderate airway crowding, due to narrow airway entry, tonsillar size are 1+, and elevated uvula. Mallampati is class II. Tongue protrudes centrally and palate elevates symmetrically.   Chest: Clear to auscultation without wheezing, rhonchi or crackles noted.  Heart: S1+S2+0, regular and normal without murmurs, rubs or gallops noted.   Abdomen: Soft, non-tender and non-distended with normal bowel sounds appreciated on auscultation.  Extremities: There is no pitting edema in the distal lower extremities bilaterally. Pedal pulses are intact.  Skin: Warm and dry without trophic changes noted. There are no varicose  veins.  Musculoskeletal: exam reveals no obvious joint deformities, tenderness or joint swelling or erythema.   Neurologically:  Mental status: The patient is awake, alert and oriented in all 4 spheres. Her memory, attention, language and knowledge are appropriate. There is no aphasia, agnosia, apraxia or anomia. Speech is clear with normal prosody and enunciation. Thought process is linear. Mood is congruent and affect is normal.  Cranial nerves are as described above under HEENT exam. In addition, shoulder shrug is normal with equal shoulder height noted. Motor exam: Normal bulk, strength and tone is noted. There is no drift, tremor or rebound. Romberg is negative. Reflexes are 2+ throughout.  Toes are downgoing bilaterally. Fine motor skills are intact.  Cerebellar testing shows no dysmetria or intention tremor on finger to nose testing. There is no truncal or gait ataxia.  Sensory exam is intact to light touch, pinprick, vibration, temperature sense and proprioception in the upper and lower extremities.  Gait, station and balance are unremarkable. No veering to one side is noted. No leaning to one side is noted. Posture is age-appropriate and stance is narrow based. No problems turning are noted. She turns en bloc. Tandem walk is unremarkable. Intact toe and heel stance is noted.               Assessment and Plan:   In summary, Jase D Bonebrake is a very pleasant 62 year old female with a history of snoring and a recent sleep study demonstrating evidence of upper airway resistance and most likely obesity hypoventilation resulting in hypoxemia. She does have a prior history of lung insult in that she was a heavy smoker for 30 years and quit 5 years ago. About 3 years ago she was treated for pneumonia and after that she also had some exposure to mold in her school bus she states. I really would like for her to reschedule her appointment with pulmonology to be checked out to make sure we don't need to  treat her in any other way. From my end of things she does not require sleep apnea treatment with a CPAP machine which is good news. I think it we'll certainly help her quite a bit to lose weight which will likely improve her snoring and her breathing at night. At this juncture I have asked her to call the pulmonology office again and reschedule her appointment. Unfortunately she canceled the appointment she was given for tomorrow. She is agreeable. I can most likely see her back on an as-needed basis. I have encouraged her to continue to strive for weight loss and congratulated her success thus far. She is advised to followup with her primary care physician as previously scheduled for routine checkups. She is stable with regards to her restless leg syndrome and can continue followup with her primary care physician in that regard.

## 2013-06-20 NOTE — Patient Instructions (Signed)
Please re-schedule your appt with pulmonology.  I will see you back as needed.  Please continue your weight loss efforts! Follow up with Dr. Everlene Other as scheduled.

## 2013-06-21 ENCOUNTER — Institutional Professional Consult (permissible substitution): Payer: Self-pay | Admitting: Internal Medicine

## 2014-11-18 DIAGNOSIS — F17200 Nicotine dependence, unspecified, uncomplicated: Secondary | ICD-10-CM | POA: Insufficient documentation

## 2016-01-19 DIAGNOSIS — Z8739 Personal history of other diseases of the musculoskeletal system and connective tissue: Secondary | ICD-10-CM | POA: Insufficient documentation

## 2016-01-19 DIAGNOSIS — Z1159 Encounter for screening for other viral diseases: Secondary | ICD-10-CM | POA: Insufficient documentation

## 2016-01-19 DIAGNOSIS — Z1211 Encounter for screening for malignant neoplasm of colon: Secondary | ICD-10-CM | POA: Insufficient documentation

## 2016-01-19 DIAGNOSIS — Z0000 Encounter for general adult medical examination without abnormal findings: Secondary | ICD-10-CM | POA: Insufficient documentation

## 2016-02-26 DIAGNOSIS — M8580 Other specified disorders of bone density and structure, unspecified site: Secondary | ICD-10-CM | POA: Insufficient documentation

## 2017-06-04 DIAGNOSIS — Z13820 Encounter for screening for osteoporosis: Secondary | ICD-10-CM | POA: Insufficient documentation

## 2018-07-20 ENCOUNTER — Encounter: Payer: Self-pay | Admitting: Neurology

## 2018-07-20 ENCOUNTER — Ambulatory Visit: Payer: Medicare Other | Admitting: Neurology

## 2018-07-20 VITALS — BP 136/76 | HR 67 | Ht 64.0 in | Wt 205.0 lb

## 2018-07-20 DIAGNOSIS — G4734 Idiopathic sleep related nonobstructive alveolar hypoventilation: Secondary | ICD-10-CM

## 2018-07-20 DIAGNOSIS — R0683 Snoring: Secondary | ICD-10-CM | POA: Diagnosis not present

## 2018-07-20 DIAGNOSIS — E669 Obesity, unspecified: Secondary | ICD-10-CM

## 2018-07-20 DIAGNOSIS — G4719 Other hypersomnia: Secondary | ICD-10-CM

## 2018-07-20 NOTE — Patient Instructions (Signed)
Please stop smoking, I think it is essential for your oxygen saturations at night. While I would be happy to do another sleep with you, the last sleep study some 5 years ago did not show any significant sleep apnea, but you did have lower oxygen saturations throughout the night, average was 88%, lowest was 85%. You will benefit from seeing a lung doctor. Please discuss with Dr. Everlene Other as well, he may want to make the referral to a lung specialist. Please also work on weight loss and talk to Dr. Everlene Other again about smoking cessation. I can see you back as needed.

## 2018-07-20 NOTE — Progress Notes (Signed)
Subjective:    Patient ID: Margaret Casey is a 67 y.o. female.  HPI     Star Age, MD, PhD [facility] [address]O. [place], [place] [address]  Dear Dr. Coletta Memos,   I saw your patient, Margaret Casey, upon your kind request, in my sleep clinic today for initial consultation of her sleep disorder, in particular, reevaluation for possible underlying obstructive sleep apnea. The patient is accompanied by her husband today. As you know, Ms. Margaret Casey is a 67 year old right-handed woman with an underlying medical history of hyperlipidemia, smoking, obesity, gout, allergies, and RLS, who reports snoring and daytime tiredness. Epworth sleepiness score is 8 out of 24, fatigue score is 52 out of 63 today. I reviewed your office note from 05/17/2018, which you kindly included. I have seen her over 5 years ago for concern for sleep apnea. She had a sleep study in September 2014 which showed lower oxygen saturations, no OSA. She was advised to seek consultation with pulmonology at the time. She did not pursue this.   Of note, she smokes about a pack a day. She does not currently utilize alcohol, is drinks caffeine in the form of coffee, 3 cups a day on average. She is retired and lives with her husband. They have 3 grown children. She is currently not actively trying to quit smoking but has tried Chantix in the past but reports that she had suicidal ideations at the time. She tries to keep a set schedule for her bedtime and rise time. Her husband has sleep apnea and uses a CPAP. They don't always sleep in the same bedroom. Her snoring has become worse and she has had some weight gain. Compared to her sleep study from September 2014 she has gained about 7 pounds. She does not have a night to night nocturia and denies morning headaches. Bedtime is around 10 and rise time around 8. She reports occasional shortness of breath with activity. She does not have a history of asthma  or recurrent bronchitis, had pneumonia once some years ago which was treated as an outpatient  Previously:  06/20/2013: 67 year old right-handed woman with an underlying medical history of obesity, RLS on Requip at 3 mg q HS, hyperlipidemia, gout, arthritis, and prior history of uterine cancer, who presents for followup consultation after a recent sleep study. I first met her on 05/11/2013, at which time she reported weight gain since her last sleep study and nonrestorative sleep as well as worsening snoring in the past several months. She had baseline sleep study on 06/01/2013 and I went over her test results with her in detail today. Sleep efficiency was reduced at 74.7% with a latency to sleep normal at 13 minutes but wake after sleep onset of 92 minutes with moderate sleep fragmentation noted. The arousal index was normal at 6.4 arousals per hour, due primarily to respiratory effort. She had an increased percentage of stage I and stage II sleep at 14.3 and 63% respectively, and decreased percentage of slow-wave sleep at 11.3%, and a decreased percentage of REM sleep at 11.4% with a highly prolonged REM latency of 360 minutes. She had no significant period leg movements. She had no significant cardiac arrhythmias. She had intermittent mild to moderate snoring. She had a total of 9 obstructive hypopneas, rendering an overall normal AHI of 1.7 per hour. Her RDI was 5.4 per hour. Her baseline oxygen saturation was noted to be only 88% and her nadir was 85%. She spent  5 hours and 5 minutes below the saturation of 90% and time below 88% saturation was 3 hours and 44 minutes. Based on the findings and her sleep related hypoxemia I suggested consultation with a pulmonologist and put a referral in. She was scheduled with Dr. Melvyn Novas on 06/21/2013, but cancelled it as she feels, her oxygen problem is weight related. She has a hx of smoking and quit 5 years ago and had pneumonia 3 years ago, treated with oral ABx. 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 She has started a low calorie diet and has been exercising more and in fact, has lost about 3 lb in the last 5 days.  Her Past Medical History Is Significant For: Past Medical History:  Diagnosis Date  . Allergy   . Gout   . Hyperlipidemia   . Obesity   . Restless leg syndrome     Her Past Surgical History Is Significant For: Past Surgical History:  Procedure Laterality Date  . ABDOMINAL HYSTERECTOMY    . CHOLECYSTECTOMY      Her Family History Is Significant For: Family History  Problem Relation Age of Onset  . Heart disease Father   . Hypertension Father     Her Social History Is Significant For: Social History   Socioeconomic History  . Marital status: Married    Spouse name: Not on file  . Number of children: Not on file  . Years of education: Not on file  . Highest education level: Not on file  Occupational History  . Occupation: Designer, fashion/clothing  . Financial resource strain: Not on file  . Food insecurity:    Worry: Not on file    Inability: Not on file  . Transportation needs:    Medical: Not on file    Non-medical: Not on file  Tobacco Use  . Smoking status: Former Research scientist (life sciences)  . Smokeless tobacco: Never Used  Substance and Sexual Activity  . Alcohol use: No  . Drug use: No  . Sexual activity: Not on file  Lifestyle  . Physical activity:    Days per week: Not on file    Minutes per session: Not on file  . Stress: Not on file  Relationships  . Social connections:    Talks on phone: Not on file    Gets together: Not on file    Attends religious service: Not on file    Active member of club or organization: Not on file    Attends meetings of clubs or organizations: Not on file    Relationship status: Not on file  Other Topics Concern  . Not on file  Social History Narrative  . Not on file    Her Allergies Are:  Allergies  Allergen Reactions  . Hydrocodone   . Latex   .  Prednisone   :   Her Current Medications Are:  Outpatient Encounter Medications as of 07/20/2018  Medication Sig  . Ascorbic Acid (VITAMIN C) 100 MG tablet Take 100 mg by mouth daily.  Marland Kitchen aspirin 81 MG tablet Take 81 mg by mouth daily.  . Calcium Carbonate (CALCIUM 600 PO) Take by mouth.  . Cholecalciferol (VITAMIN D3) 2000 UNITS TABS Take by mouth.  . fish oil-omega-3 fatty acids 1000 MG capsule Take 2 g by mouth daily.  . Melatonin 3 MG CAPS Take by mouth.  . Misc Natural Products (TART CHERRY ADVANCED PO) Take 2,500 Units by mouth daily.  Marland Kitchen  pravastatin (PRAVACHOL) 10 MG tablet Take 10 mg by mouth daily.  . thiamine (VITAMIN B-1) 100 MG tablet Take 100 mg by mouth daily.  . vitamin B-12 (CYANOCOBALAMIN) 500 MCG tablet Take 500 mcg by mouth daily.  . vitamin E 400 UNIT capsule Take 400 Units by mouth daily.  . [DISCONTINUED] Azelastine HCl 0.15 % SOLN Place 2 sprays into the nose 2 (two) times daily.  . [DISCONTINUED] Loratadine 10 MG CAPS Take 1 capsule (10 mg total) by mouth 1 day or 1 dose.  . [DISCONTINUED] rOPINIRole (REQUIP) 2 MG tablet Take 3 mg by mouth at bedtime. TAKE 1 TABLET BY MOUTH AT BEDTIME   No facility-administered encounter medications on file as of 07/20/2018.   : Review of Systems:  Out of a complete 14 point review of systems, all are reviewed and negative with the exception of these symptoms as listed below:  Review of Systems  Neurological:       Pt presents today to discuss her sleep. Pt has had a sleep study before and does not think it was abnormal but does endorse snoring.  Epworth Sleepiness Scale 0= would never doze 1= slight chance of dozing 2= moderate chance of dozing 3= high chance of dozing  Sitting and reading: 3 Watching TV: 1 Sitting inactive in a public place (ex. Theater or meeting): 1 As a passenger in a car for an hour without a break: 3 Lying down to rest in the afternoon: 0 Sitting and talking to someone: 0 Sitting quietly after  lunch (no alcohol): 0 In a car, while stopped in traffic: 0 Total: 8     Objective:  Neurological Exam  Physical Exam Physical Examination:   Vitals:   07/20/18 1308  BP: 136/76  Pulse: 67   General Examination: The patient is a very pleasant 66 y.o. female in no acute distress. She appears well-developed and well-nourished and well groomed.   HEENT: Normocephalic, atraumatic, pupils are equal, round and reactive to light and accommodation. Extraocular tracking is good without limitation to gaze excursion or nystagmus noted. Normal smooth pursuit is noted. Hearing is grossly intact. Face is symmetric with normal facial animation and normal facial sensation. Speech is clear with no dysarthria noted. There is no hypophonia. There is no lip, or voice tremor, but she does have a no-no type head tremor, not new. Neck is supple with full range of passive and active motion. Oropharynx exam reveals: mild mouth dryness, adequate dental hygiene and moderate airway crowding, due to narrow airway entry, tonsillar size are 1+, and longer uvula. Mallampati is class II. Tongue protrudes centrally and palate elevates symmetrically. Neck circumference is 14-7/8 inches.  Chest: Clear to auscultation without wheezing, rhonchi or crackles noted.  Heart: S1+S2+0, regular and normal without murmurs, rubs or gallops noted.   Abdomen: Soft, non-tender and non-distended with normal bowel sounds appreciated on auscultation.  Extremities: There is no pitting edema in the distal lower extremities bilaterally.   Skin: Warm and dry without trophic changes noted.   Musculoskeletal: exam reveals no obvious joint deformities, tenderness or joint swelling or erythema.   Neurologically:  Mental status: The patient is awake, alert and oriented in all 4 spheres. Her memory, attention, language and knowledge are appropriate. There is no aphasia, agnosia, apraxia or anomia. Speech is clear with normal prosody and  enunciation. Thought process is linear. Mood is congruent and affect is normal.  Cranial nerves are as described above under HEENT exam.  Motor exam: Normal bulk, strength   and tone is noted. There is no drift, tremor or rebound. Romberg is negative. Fine motor skills are intact grossly.  Cerebellar testing shows no dysmetria or intention tremor on finger to nose testing. There is no truncal or gait ataxia.  Sensory exam is intact to light touch in the upper and lower extremities.  Gait, station and balance are unremarkable. No veering to one side is noted. No leaning to one side is noted. Posture is age-appropriate and stance is narrow based. No problems turning are noted. She turns en bloc. Tandem walk is unremarkable after initial insecurity.               Assessment and Plan:   In summary, Qiara D Brumbaugh is a very pleasant 66-year old female with an underlying medical history of hyperlipidemia, smoking, obesity, gout, allergies, and RLS, who presents for evaluation of her worsening snoring and concern for underlying obstructive sleep apnea. She had sleep study testing over 5 years ago which did not show any obstructive sleep disordered breathing but did show lower oxygen saturations with an average of 88%, nadir of 85%. She was advised at the time to seek consultation with pulmonologist. She has a history of pneumonia once and a long-standing history of smoking, she currently smokes 1 pack per day. While I would be happy to repeat her sleep study, I think it would be more fruitful if she were to establish care with a pulmonologist. She did not pursue this at the time but would be willing to do so. She is encouraged to talk to you about smoking cessation and also discuss referral to pulmonology as she would prefer to get a referral through your office. She has gained a little bit of weight and is encouraged to pursue weight loss. She reports that she is working on it. I can see her back as needed. I  answered all their questions today and the patient and her husband were in agreement. Thank you very much for allowing me to participate in the care of this nice patient. If I can be of any further assistance to you please do not hesitate to call me at [phone].  Sincerely,   Saima Athar, MD, PhD  

## 2018-11-14 DIAGNOSIS — N183 Chronic kidney disease, stage 3 unspecified: Secondary | ICD-10-CM | POA: Insufficient documentation

## 2019-04-01 ENCOUNTER — Other Ambulatory Visit: Payer: Self-pay

## 2019-04-01 ENCOUNTER — Ambulatory Visit (HOSPITAL_COMMUNITY)
Admission: EM | Admit: 2019-04-01 | Discharge: 2019-04-01 | Disposition: A | Discharge status: Home / Self Care | Payer: Medicare Other

## 2019-04-01 ENCOUNTER — Encounter (HOSPITAL_COMMUNITY): Payer: Self-pay | Admitting: *Deleted

## 2019-04-01 DIAGNOSIS — R220 Localized swelling, mass and lump, head: Secondary | ICD-10-CM | POA: Diagnosis not present

## 2019-04-01 DIAGNOSIS — W57XXXA Bitten or stung by nonvenomous insect and other nonvenomous arthropods, initial encounter: Secondary | ICD-10-CM | POA: Diagnosis not present

## 2019-04-01 DIAGNOSIS — S0086XA Insect bite (nonvenomous) of other part of head, initial encounter: Secondary | ICD-10-CM

## 2019-04-01 DIAGNOSIS — S0096XA Insect bite (nonvenomous) of unspecified part of head, initial encounter: Secondary | ICD-10-CM | POA: Diagnosis not present

## 2019-04-01 MED ORDER — CETIRIZINE HCL 5 MG PO TABS: 5.0000 mg | ORAL_TABLET | Freq: Every day | ORAL | 0 refills | Status: DC

## 2019-04-01 MED ORDER — DEXAMETHASONE SODIUM PHOSPHATE 10 MG/ML IJ SOLN
10.0000 mg | Freq: Once | INTRAMUSCULAR | Status: AC
  Administered 2019-04-01: 10 mg via INTRAMUSCULAR

## 2019-04-01 MED ORDER — DEXAMETHASONE SODIUM PHOSPHATE 10 MG/ML IJ SOLN
INTRAMUSCULAR | Status: AC
  Filled 2019-04-01: qty 1

## 2019-04-01 NOTE — ED Triage Notes (Signed)
Denies any tongue, oral, or throat swelling/sensations.

## 2019-04-01 NOTE — ED Provider Notes (Signed)
[facility]    CSN: [id number] Arrival date & time: 04/01/19  1001      History   Chief Complaint Chief Complaint  Patient presents with  . Insect Bite    HPI Margaret Casey is a 68 y.o. female.   Margaret Casey presents with complaints of facial skin redness and swelling which started two days ago. She was out working in the yard, there was a lot of mosquitos, she was bit multiple other areas. Didn't specifically notice a facial bite but originally noted a small area to her chin/jaw region. Yesterday and today with increased swelling to her face with itching burning and redness. No tongue or throat itching or swelling. No lip swelling. No difficulty swallowing, no wheezing or coughing. Denies any previous similar. Hasn't taken any new medications, no other known allergens. Hasn't taken any medications for symptoms. Hx of allergies, gout, restless leg.    ROS per HPI, negative if not otherwise mentioned.      Past Medical History:  Diagnosis Date  . Allergy   . Gout   . Hyperlipidemia   . Obesity   . Restless leg syndrome     Patient Active Problem List   Diagnosis Date Noted  . Gout   . Obesity   . Restless leg syndrome     Past Surgical History:  Procedure Laterality Date  . ABDOMINAL HYSTERECTOMY    . CHOLECYSTECTOMY      OB History   No obstetric history on file.      Home Medications    Prior to Admission medications   Medication Sig Start Date End Date Taking? Authorizing Provider  Ascorbic Acid (VITAMIN C) 100 MG tablet Take 100 mg by mouth daily.   Yes [provider]  aspirin 81 MG tablet Take 81 mg by mouth daily.   Yes [provider]  Calcium Carbonate (CALCIUM 600 PO) Take by mouth.   Yes [provider]  Cholecalciferol (VITAMIN D3) 2000 UNITS TABS Take by mouth.   Yes [provider]  fish oil-omega-3 fatty acids 1000 MG capsule Take 2 g by mouth daily.   Yes [provider]   Melatonin 3 MG CAPS Take by mouth.   Yes [provider]  Misc Natural Products (TART CHERRY ADVANCED PO) Take 2,500 Units by mouth daily.   Yes [provider]  pravastatin (PRAVACHOL) 10 MG tablet Take 10 mg by mouth daily.   Yes [provider]  thiamine (VITAMIN B-1) 100 MG tablet Take 100 mg by mouth daily.   Yes [provider]  TURMERIC PO Take by mouth.   Yes [provider]  vitamin B-12 (CYANOCOBALAMIN) 500 MCG tablet Take 500 mcg by mouth daily.   Yes [provider]  vitamin E 400 UNIT capsule Take 400 Units by mouth daily.   Yes [provider]  cetirizine (ZYRTEC) 5 MG tablet Take 1 tablet (5 mg total) by mouth daily. 04/01/19   Georgetta HaberBurky, Gaylyn Berish B, NP    Family History Family History  Problem Relation Age of Onset  . Heart disease Father   . Hypertension Father     Social History Social History   Tobacco Use  . Smoking status: Current Every Day Smoker  . Smokeless tobacco: Never Used  Substance Use Topics  . Alcohol use: No  . Drug use: No     Allergies   Hydrocodone, Latex, and Prednisone   Review of Systems Review of Systems  Physical Exam Triage Vital Signs ED Triage Vitals  Enc Vitals Group     BP --      Pulse Rate 04/01/19 1012 77     Resp --      Temp 04/01/19 1014 98.2 F (36.8 C)     Temp Source 04/01/19 1014 Oral     SpO2 04/01/19 1012 97 %     Weight --      Height --      Head Circumference --      Peak Flow --      Pain Score 04/01/19 1012 5     Pain Loc --      Pain Edu? --      Excl. in GC? --    No data found.  Updated Vital Signs Pulse 77   Temp 98.2 F (36.8 C) (Oral)   SpO2 97%   Visual Acuity Right Eye Distance:   Left Eye Distance:   Bilateral Distance:    Right Eye Near:   Left Eye Near:    Bilateral Near:     Physical Exam Constitutional:      General: She is not in acute distress.    Appearance: She is well-developed.  HENT:     Head:  Normocephalic and atraumatic.      Comments: Jawline to cheeks with redness and mild swelling, area to left lower jaw with apparent original mosquito bite, no open skin; eyes and lids WNL; no rash; lips WNL; swallowing without difficulty and opening mouth without difficulty Cardiovascular:     Rate and Rhythm: Normal rate.  Pulmonary:     Effort: Pulmonary effort is normal.     Breath sounds: Normal breath sounds.  Skin:    General: Skin is warm and dry.  Neurological:     Mental Status: She is alert and oriented to person, place, and time.      UC Treatments / Results  Labs (all labs ordered are listed, but only abnormal results are displayed) Labs Reviewed - No data to display  EKG   Radiology No results found.  Procedures Procedures (including critical care time)  Medications Ordered in UC Medications  dexamethasone (DECADRON) injection 10 mg (10 mg Intramuscular Given 04/01/19 1041)  dexamethasone (DECADRON) 10 MG/ML injection (has no administration in time range)    Initial Impression / Assessment and Plan / UC Course  I have reviewed the triage vital signs and the nursing notes.  Pertinent labs & imaging results that were available during my care of the patient were reviewed by me and considered in my medical decision making (see chart for details).     Swelling to soft tissue of face s/p apparent bug bite. Didn't witness the bite, however. Cellulitis also considered. Itches. Decadron provided and encouraged antihistamines. Return precautions provided. Patient verbalized understanding and agreeable to plan.   Final Clinical Impressions(s) / UC Diagnoses   Final diagnoses:  Facial swelling  Insect bite of other part of head, initial encounter     Discharge Instructions     Daily zyrtec to help with allergic response and itching. May supplement with benadryl at night.  We have given you an injection of steroid today which should help with your symptoms.   Cool compresses.  Please return or go to the ER for any worsening of swelling, swelling or itching to tongue or throat, difficulty swallowing, wheezing or otherwise worsening.    ED Prescriptions    Medication Sig Dispense Auth. Provider   cetirizine (  ZYRTEC) 5 MG tablet Take 1 tablet (5 mg total) by mouth daily. 30 tablet Zigmund Gottron, NP     Controlled Substance Prescriptions [place] Controlled Substance Registry consulted? Not Applicable   Zigmund Gottron, NP 04/01/19 2130

## 2019-04-01 NOTE — ED Triage Notes (Signed)
Reports working in garden 3 days ago; woke up 2 days ago with eye & facial swelling - L>R.  Has been using Benadryl and ice.  States now redness is extending down into neck.  Denies fevers.

## 2019-04-01 NOTE — Discharge Instructions (Signed)
Daily zyrtec to help with allergic response and itching. May supplement with benadryl at night.  We have given you an injection of steroid today which should help with your symptoms.  Cool compresses.  Please return or go to the ER for any worsening of swelling, swelling or itching to tongue or throat, difficulty swallowing, wheezing or otherwise worsening.

## 2019-05-25 ENCOUNTER — Other Ambulatory Visit: Payer: Self-pay | Admitting: Family Medicine

## 2019-05-25 DIAGNOSIS — Z1231 Encounter for screening mammogram for malignant neoplasm of breast: Secondary | ICD-10-CM

## 2019-07-11 ENCOUNTER — Ambulatory Visit: Payer: Medicare Other

## 2019-07-13 ENCOUNTER — Ambulatory Visit
Admission: RE | Admit: 2019-07-13 | Discharge: 2019-07-13 | Disposition: A | Discharge status: Home / Self Care | Payer: Medicare Other | Source: Ambulatory Visit | Attending: Family Medicine | Admitting: Family Medicine

## 2019-07-13 ENCOUNTER — Other Ambulatory Visit: Payer: Self-pay

## 2019-07-13 DIAGNOSIS — Z1231 Encounter for screening mammogram for malignant neoplasm of breast: Secondary | ICD-10-CM

## 2019-10-15 ENCOUNTER — Ambulatory Visit: Payer: Medicare Other | Attending: Internal Medicine

## 2019-10-15 DIAGNOSIS — Z23 Encounter for immunization: Secondary | ICD-10-CM | POA: Insufficient documentation

## 2019-10-15 NOTE — Progress Notes (Signed)
   Covid-19 Vaccination Clinic  Name:  KORBIN NOTARO    MRN: [id number] DOB: Nov 18, 1950  10/15/2019  Ms. Whitford was observed post Covid-19 immunization for 15 minutes without incidence. She was provided with Vaccine Information Sheet and instruction to access the V-Safe system.   Ms. Sarin was instructed to call 911 with any severe reactions post vaccine: Marland Kitchen Difficulty breathing  . Swelling of your face and throat  . A fast heartbeat  . A bad rash all over your body  . Dizziness and weakness    Immunizations Administered    Name Date Dose VIS Date Route   Pfizer COVID-19 Vaccine 10/15/2019 11:56 AM 0.3 mL 08/31/2019 Intramuscular   Manufacturer: [employer], [employer]   Lot: [id number]   NDC: [address]-7

## 2019-11-05 ENCOUNTER — Ambulatory Visit: Payer: Medicare Other | Attending: Internal Medicine

## 2019-11-05 DIAGNOSIS — Z23 Encounter for immunization: Secondary | ICD-10-CM | POA: Insufficient documentation

## 2019-11-05 NOTE — Progress Notes (Signed)
   Covid-19 Vaccination Clinic  Name:  Margaret Casey    MRN: [id number] DOB: 1951-07-28  11/05/2019  [name] was observed post Covid-19 immunization for 15 minutes without incidence. She was provided with Vaccine Information Sheet and instruction to access the V-Safe system.   [name] was instructed to call 911 with any severe reactions post vaccine: Marland Kitchen Difficulty breathing  . Swelling of your face and throat  . A fast heartbeat  . A bad rash all over your body  . Dizziness and weakness    Immunizations Administered    Name Date Dose VIS Date Route   Pfizer COVID-19 Vaccine 11/05/2019 11:09 AM 0.3 mL 08/31/2019 Intramuscular   Manufacturer: [employer], [employer]   Lot: [id number]   NDC: [address]-6

## 2021-03-25 NOTE — Progress Notes (Signed)
03/26/21- 69 yoF Smoker for sleep evaluation courtesy of Margaret Marry, NP  [facility]) with concern of Nocturnal Hypoxemia and loud snoring Medical problem list includes  obesity, Hyperlipidemia, Gout, Restless Legs NPSG ([facility]/ [facility])  06/01/13--AHI 1.7/ hr, desaturation to 85% with over 3 hours w O2 sat less than 88%, body weight 198 lbs She had previously not followed through with recommendation to see pulmonary for nocturnal hypoxemia suspected due to OHS. -----Snoring for a few years, wakes up often due to snoring Epworth score- incomplete Body weight today-194 lbs Husband has her sleep in separate room due to her snoring. He is on CPAP. She admits her snoring wakes her, but minimizes daytime fatigue. Melatonin 5 mg, 2 cups AM coffee.  Denies complex parasomnias. Smokes 1/2-1 ppd, but denies known lung disease, heart disease. No ENT surgery. CXR 01/26/10-  IMPRESSION:  No acute findings.  Prior to Admission medications   Medication Sig Start Date End Date Taking? Authorizing Provider  Ascorbic Acid (VITAMIN C) 100 MG tablet Take 100 mg by mouth daily.   Yes [provider]  aspirin 81 MG tablet Take 81 mg by mouth daily.   Yes [provider]  Calcium Carbonate (CALCIUM 600 PO) Take by mouth.   Yes [provider]  cetirizine (ZYRTEC) 5 MG tablet Take 1 tablet (5 mg total) by mouth daily. 04/01/19  Yes Linus Mako B, NP  Cholecalciferol (VITAMIN D3) 2000 UNITS TABS Take by mouth.   Yes [provider]  fish oil-omega-3 fatty acids 1000 MG capsule Take 2 g by mouth daily.   Yes [provider]  Melatonin 3 MG CAPS Take by mouth.   Yes [provider]  Misc Natural Products (TART CHERRY ADVANCED PO) Take 2,500 Units by mouth daily.   Yes [provider]  pravastatin (PRAVACHOL) 10 MG tablet Take 10 mg by mouth daily.   Yes [provider]  thiamine (VITAMIN B-1) 100 MG tablet Take 100 mg by mouth daily.    Yes [provider]  TURMERIC PO Take by mouth.   Yes [provider]  vitamin B-12 (CYANOCOBALAMIN) 500 MCG tablet Take 500 mcg by mouth daily.   Yes [provider]  vitamin E 400 UNIT capsule Take 400 Units by mouth daily.   Yes [provider]   Past Medical History:  Diagnosis Date   Allergy    Gout    Hyperlipidemia    Obesity    Restless leg syndrome    Past Surgical History:  Procedure Laterality Date   ABDOMINAL HYSTERECTOMY     CHOLECYSTECTOMY     Family History  Problem Relation Age of Onset   Heart disease Father    Hypertension Father    Social History   Socioeconomic History   Marital status: Married    Spouse name: Not on file   Number of children: Not on file   Years of education: Not on file   Highest education level: Not on file  Occupational History   Occupation: Midwife  Tobacco Use   Smoking status: Former    Packs/day: 1.00    Pack years: 0.00    Types: Cigarettes    Quit date: 1998    Years since quitting: 24.5   Smokeless tobacco: Never  Vaping Use   Vaping Use: Never used  Substance and Sexual Activity   Alcohol use: No   Drug use: No   Sexual activity: Not on file  Other Topics Concern  Not on file  Social History Narrative   Not on file   Social Determinants of Health   Financial Resource Strain: Not on file  Food Insecurity: Not on file  Transportation Needs: Not on file  Physical Activity: Not on file  Stress: Not on file  Social Connections: Not on file  Intimate Partner Violence: Not on file   ROS-see HPI   + = positive Constitutional:    weight loss, night sweats, fevers, chills, fatigue, lassitude. HEENT:    headaches, difficulty swallowing, tooth/dental problems, sore throat,       sneezing, itching, ear ache, nasal congestion, post nasal drip, snoring CV:    chest pain, orthopnea, PND, swelling in lower extremities, anasarca,                                   dizziness,  palpitations Resp:   shortness of breath with exertion or at rest.                productive cough,   non-productive cough, coughing up of blood.              change in color of mucus.  wheezing.   Skin:    rash or lesions. GI:  No-   heartburn, indigestion, abdominal pain, nausea, vomiting, diarrhea,                 change in bowel habits, loss of appetite GU: dysuria, change in color of urine, no urgency or frequency.   flank pain. MS:   joint pain, stiffness, decreased range of motion, back pain. Neuro-     nothing unusual Psych:  change in mood or affect.  depression or anxiety.   memory loss.  OBJ- Physical Exam General- Alert, Oriented, Affect-appropriate, Distress- none acute,  +obese Skin- rash-none, lesions- none, excoriation- none Lymphadenopathy- none Head- atraumatic            Eyes- Gross vision intact, PERRLA, conjunctivae and secretions clear            Ears- Hearing, canals-normal            Nose- Clear, no-Septal dev, mucus, polyps, erosion, perforation             Throat- Mallampati II , mucosa clear , drainage- none, tonsils- atrophic, +teeth Neck- flexible , trachea midline, no stridor , thyroid nl, carotid no bruit Chest - symmetrical excursion , unlabored           Heart/CV- RRR , no murmur , no gallop  , no rub, nl s1 s2                           - JVD- none , edema- none, stasis changes- none, varices- none           Lung- clear to P&A, wheeze- none, cough- none , dullness-none, rub- none           Chest wall-  Abd-  Br/ Gen/ Rectal- Not done, not indicated Extrem- cyanosis- none, clubbing, none, atrophy- none, strength- nl Neuro- g+head bob tremor

## 2021-03-26 ENCOUNTER — Ambulatory Visit (INDEPENDENT_AMBULATORY_CARE_PROVIDER_SITE_OTHER): Payer: Medicare Other

## 2021-03-26 ENCOUNTER — Other Ambulatory Visit: Payer: Self-pay

## 2021-03-26 ENCOUNTER — Ambulatory Visit (INDEPENDENT_AMBULATORY_CARE_PROVIDER_SITE_OTHER): Payer: Medicare Other | Admitting: Internal Medicine

## 2021-03-26 ENCOUNTER — Encounter: Payer: Self-pay | Admitting: Internal Medicine

## 2021-03-26 VITALS — BP 124/74 | HR 84 | Temp 97.8°F | Ht 64.0 in | Wt 194.4 lb

## 2021-03-26 DIAGNOSIS — Z720 Tobacco use: Secondary | ICD-10-CM

## 2021-03-26 DIAGNOSIS — G4734 Idiopathic sleep related nonobstructive alveolar hypoventilation: Secondary | ICD-10-CM

## 2021-03-26 DIAGNOSIS — R0683 Snoring: Secondary | ICD-10-CM

## 2021-03-26 NOTE — Assessment & Plan Note (Signed)
Desaturation may indicate OHS or more COPD than she realizes. Plan- update sleep study, CXR, PFT

## 2021-03-26 NOTE — Patient Instructions (Signed)
Order- schedule home sleep test   dx snoring, nocturnal hypoxemia  Order- schedule PFT   dx Nocturnal hypoxemia, tobacco user    (next available)  Order- CXR    dx tobacco user  Please call if we can help  We will go over the results of these tests when we see you back.  It would be good for you to make a real effort now to stop smoking.

## 2021-03-26 NOTE — Assessment & Plan Note (Signed)
Significant snoring and weight gain since last study.  Plan- discussed evaluation. Will update sleep study.

## 2021-03-26 NOTE — Assessment & Plan Note (Signed)
She had stopped in past. Encouraged to stop again.

## 2021-05-05 ENCOUNTER — Ambulatory Visit: Payer: Medicare Other

## 2021-05-05 ENCOUNTER — Other Ambulatory Visit: Payer: Self-pay

## 2021-05-05 DIAGNOSIS — R0683 Snoring: Secondary | ICD-10-CM

## 2021-05-05 DIAGNOSIS — G4733 Obstructive sleep apnea (adult) (pediatric): Secondary | ICD-10-CM

## 2021-05-05 DIAGNOSIS — G4734 Idiopathic sleep related nonobstructive alveolar hypoventilation: Secondary | ICD-10-CM

## 2021-05-15 DIAGNOSIS — R0683 Snoring: Secondary | ICD-10-CM

## 2021-06-18 ENCOUNTER — Other Ambulatory Visit: Payer: Self-pay | Admitting: Family Medicine

## 2021-06-18 DIAGNOSIS — Z1231 Encounter for screening mammogram for malignant neoplasm of breast: Secondary | ICD-10-CM

## 2021-06-29 ENCOUNTER — Telehealth: Payer: Self-pay | Admitting: Internal Medicine

## 2021-06-29 NOTE — Telephone Encounter (Signed)
Ok to use held spot on 10/21 and to cancel our PFT. Please work with her to make sure we get results pf PFT and PET when they are done in [facility].

## 2021-06-29 NOTE — Telephone Encounter (Signed)
Called and spoke with pt and she stated that she is having the PFT done on 10/12 at [facility] and the PET done on 10/18.  She is scheduled for PFT and OV with CY at our office on 10/13 and would like to reschedule her OV and cancel the PFT.  She is wanting to see if she can rescheudule her OV to 10/21 but this is only a held spot.  CY please advise if we can rsch to 10/21.  Thanks

## 2021-06-29 NOTE — Telephone Encounter (Signed)
Called and spoke with pt and she is aware of appt changed to see CY on 10/21 at 1130.  I have cancelled the appt and PFT for 10/13 and the pt is aware.  She is aware to have these results sent over to [facility] so he can review these with her.

## 2021-07-02 ENCOUNTER — Ambulatory Visit: Payer: Medicare Other | Admitting: Internal Medicine

## 2021-07-09 NOTE — Progress Notes (Signed)
03/26/21- 69 yoF Smoker for sleep evaluation courtesy of Margaret Marry, NP  [facility]) with concern of Nocturnal Hypoxemia and loud snoring Medical problem list includes  obesity, Hyperlipidemia, Gout, Restless Legs NPSG ([facility])  06/01/13--AHI 1.7/ hr, desaturation to 85% with over 3 hours w O2 sat less than 88%, body weight 198 lbs She had previously not followed through with recommendation to see pulmonary for nocturnal hypoxemia suspected due to OHS. -----Snoring for a few years, wakes up often due to snoring Epworth score- incomplete Body weight today-194 lbs Husband has her sleep in separate room due to her snoring. He is on CPAP. She admits her snoring wakes her, but minimizes daytime fatigue. Melatonin 5 mg, 2 cups AM coffee.  Denies complex parasomnias. Smokes 1/2-1 ppd, but denies known lung disease, heart disease. No ENT surgery. CXR 01/26/10-  IMPRESSION:  No acute findings.  06/2121- 69 yoF Smoker followed for Nocturnal Hypoxemia (AHI 2.4/ hr), complicated by  obesity, Hyperlipidemia, Gout, Restless Legs Body weight today-200 lbs Covid vax-3 Phizer Flu vax-had PFT pending- HST 05/05/21- AHI 2.4/ hr, desaturation to 81-88%, body weight 194 lbs Had PFT at [facility] in [place] need her to sign a release so we can get this report. Unfortunately she is not trying to stop smoking. CXR 03/26/21-  FINDINGS: The heart size and mediastinal contours are within normal limits. Both lungs are clear. The visualized skeletal structures are unremarkable. IMPRESSION: No active cardiopulmonary disease.   ROS-see HPI   + = positive Constitutional:    weight loss, night sweats, fevers, chills, fatigue, lassitude. HEENT:    headaches, difficulty swallowing, tooth/dental problems, sore throat,       sneezing, itching, ear ache, nasal congestion, post nasal drip, snoring CV:    chest pain, orthopnea, PND, swelling in lower extremities, anasarca,                                    dizziness, palpitations Resp:   shortness of breath with exertion or at rest.                productive cough,   non-productive cough, coughing up of blood.              change in color of mucus.  wheezing.   Skin:    rash or lesions. GI:  No-   heartburn, indigestion, abdominal pain, nausea, vomiting, diarrhea,                 change in bowel habits, loss of appetite GU: dysuria, change in color of urine, no urgency or frequency.   flank pain. MS:   joint pain, stiffness, decreased range of motion, back pain. Neuro-     nothing unusual Psych:  change in mood or affect.  depression or anxiety.   memory loss.  OBJ- Physical Exam General- Alert, Oriented, Affect-appropriate, Distress- none acute,  +obese Skin- rash-none, lesions- none, excoriation- none Lymphadenopathy- none Head- atraumatic            Eyes- Gross vision intact, PERRLA, conjunctivae and secretions clear            Ears- Hearing, canals-normal            Nose- Clear, no-Septal dev, mucus, polyps, erosion, perforation             Throat- Mallampati II , mucosa clear , drainage- none, tonsils- atrophic, +teeth Neck- flexible , trachea  midline, no stridor , thyroid nl, carotid no bruit Chest - symmetrical excursion , unlabored           Heart/CV- RRR , no murmur , no gallop  , no rub, nl s1 s2                           - JVD- none , edema- none, stasis changes- none, varices- none           Lung- clear to P&A, wheeze- none, cough- none , dullness-none, rub- none           Chest wall-  Abd-  Br/ Gen/ Rectal- Not done, not indicated Extrem- cyanosis- none, clubbing, none, atrophy- none, strength- nl Neuro- +head bob tremor

## 2021-07-10 ENCOUNTER — Ambulatory Visit: Payer: Medicare Other | Admitting: Internal Medicine

## 2021-07-10 ENCOUNTER — Other Ambulatory Visit: Payer: Self-pay

## 2021-07-10 ENCOUNTER — Encounter: Payer: Self-pay | Admitting: Internal Medicine

## 2021-07-10 VITALS — BP 114/82 | HR 63 | Temp 97.9°F | Ht 64.0 in | Wt 200.4 lb

## 2021-07-10 DIAGNOSIS — Z720 Tobacco use: Secondary | ICD-10-CM

## 2021-07-10 DIAGNOSIS — G4734 Idiopathic sleep related nonobstructive alveolar hypoventilation: Secondary | ICD-10-CM | POA: Diagnosis not present

## 2021-07-10 DIAGNOSIS — Z23 Encounter for immunization: Secondary | ICD-10-CM

## 2021-07-10 NOTE — Patient Instructions (Signed)
We will contact [facility] for result of PFT you had this month  Order- schedule overnight oximetry  on room air      dx Nocturnal Hypoxemia  Please call if we can help

## 2021-07-15 ENCOUNTER — Telehealth: Payer: Self-pay | Admitting: Internal Medicine

## 2021-07-15 NOTE — Telephone Encounter (Signed)
Spoke to [place] with [facility].  Dahlia Client stated that she contacted Dr. Maple Hudson via epic regarding a bronch. Patient in need of a bronch.  Patient would like to have [facility] done at [facility].  Dahlia Client is questioning if our office could perform bx.  Dr. Maple Hudson, please advise. Thanks.

## 2021-07-16 NOTE — Telephone Encounter (Signed)
Yes, thanks for following up. I gave the information to Dr Tonia Brooms and he said he would take care of it for Margaret Casey.

## 2021-07-16 NOTE — Telephone Encounter (Signed)
  Please schedule with me in next available nodule slot.  I have a 3pm on the 14th open   Thanks   BLI

## 2021-07-16 NOTE — Telephone Encounter (Signed)
Spoke to Margaret Casey with [facility] and relayed below message.  Nothing further needed at this time.

## 2021-07-16 NOTE — Telephone Encounter (Signed)
Spoke with the pt and put her on to see BI on 08/03/21 at 3 pm advised to arrive at 2:45  Nothing further needed

## 2021-07-27 ENCOUNTER — Encounter: Payer: Self-pay | Admitting: Internal Medicine

## 2021-07-31 ENCOUNTER — Telehealth: Payer: Self-pay | Admitting: Internal Medicine

## 2021-07-31 DIAGNOSIS — G4734 Idiopathic sleep related nonobstructive alveolar hypoventilation: Secondary | ICD-10-CM

## 2021-07-31 NOTE — Telephone Encounter (Signed)
Call made to patient, confirmed DOB. Made aware of results per CY. Voiced understanding. Agreeable to order. Order placed.   Nothing further needed at this time.

## 2021-07-31 NOTE — Telephone Encounter (Signed)
For documentation- I have not called patient-  While she is awaiting her appointment with Dr Tonia Brooms about evaluating her lung nodule-  New issue- Her overnight oximetry test on November 7 showed that her oxygen level while sleeping was at 88% or less for 3 hours and 35 minutes.  That is unsafe, and is in a range where we prescribe home oxygen during sleep to reduce the risk of stroke or heart attack.  Order- DME [employer]  new O2 for sleep 2L/ min    dx Nocturnal Hypoxemia

## 2021-08-03 ENCOUNTER — Ambulatory Visit: Payer: Medicare Other | Admitting: Pulmonary Disease

## 2021-08-03 ENCOUNTER — Encounter: Payer: Self-pay | Admitting: Pulmonary Disease

## 2021-08-03 ENCOUNTER — Other Ambulatory Visit: Payer: Self-pay

## 2021-08-03 VITALS — BP 120/82 | HR 66 | Temp 98.1°F | Ht 64.0 in | Wt 194.8 lb

## 2021-08-03 DIAGNOSIS — Z87891 Personal history of nicotine dependence: Secondary | ICD-10-CM | POA: Diagnosis not present

## 2021-08-03 DIAGNOSIS — R918 Other nonspecific abnormal finding of lung field: Secondary | ICD-10-CM

## 2021-08-03 DIAGNOSIS — R911 Solitary pulmonary nodule: Secondary | ICD-10-CM | POA: Diagnosis not present

## 2021-08-03 NOTE — Progress Notes (Signed)
Synopsis: Referred in lung nodule for November 2022 by Bernerd Limbo, MD  Subjective:   PATIENT ID: Margaret Casey GENDER: female DOB: 1951-06-30, MRN: [id number]  Chief Complaint  Patient presents with   Follow-up    This is a 70 year old female, past medical history of hyperlipidemia, obesity, restless leg syndrome.  Patient has establish and follow-up care with Dr. Annamaria Boots here in the office for some time.  She had a low-dose lung cancer screening CT that was completed at [facility] this past fall in the beginning of October.  This revealed a new 13 mm nodule.  She is ultimately had a PET scan that was completed a few weeks ago also at the end of October.  This PET scan showed no hypermetabolic uptake within the nodule.  The nodule itself was reviewed at the [facility] health rapid lung nodule review clinic.  This was attended by thoracic surgery pulmonary and medical oncology.  This recommendation was for consideration of navigational bronchoscopy and tissue biopsy.  I discussed the recommendations from [place] as well as reviewed patient's images today in the office with her.  Is apprehensive about consideration for tissue biopsy at this time.   Past Medical History:  Diagnosis Date   Allergy    Gout    Hyperlipidemia    Obesity    Restless leg syndrome      Family History  Problem Relation Age of Onset   Heart disease Father    Hypertension Father      Past Surgical History:  Procedure Laterality Date   ABDOMINAL HYSTERECTOMY     CHOLECYSTECTOMY      Social History   Socioeconomic History   Marital status: Married    Spouse name: Not on file   Number of children: Not on file   Years of education: Not on file   Highest education level: Not on file  Occupational History   Occupation: Recruitment consultant  Tobacco Use   Smoking status: Every Day    Packs/day: 1.00    Types: Cigarettes   Smokeless tobacco: Never  Vaping Use   Vaping Use: Never used  Substance and  Sexual Activity   Alcohol use: No   Drug use: No   Sexual activity: Not on file  Other Topics Concern   Not on file  Social History Narrative   Not on file   Social Determinants of Health   Financial Resource Strain: Not on file  Food Insecurity: Not on file  Transportation Needs: Not on file  Physical Activity: Not on file  Stress: Not on file  Social Connections: Not on file  Intimate Partner Violence: Not on file     Allergies  Allergen Reactions   Hydrocodone    Latex    Prednisone Hives and Swelling     Outpatient Medications Prior to Visit  Medication Sig Dispense Refill   Ascorbic Acid (VITAMIN C) 100 MG tablet Take 100 mg by mouth daily.     aspirin 81 MG tablet Take 81 mg by mouth daily.     Calcium Carbonate (CALCIUM 600 PO) Take by mouth.     Cholecalciferol (VITAMIN D3) 2000 UNITS TABS Take by mouth.     fish oil-omega-3 fatty acids 1000 MG capsule Take 2 g by mouth daily.     Melatonin 3 MG CAPS Take by mouth.     Misc Natural Products (TART CHERRY ADVANCED PO) Take 2,500 Units by mouth daily.     pravastatin (PRAVACHOL) 10 MG  tablet Take 10 mg by mouth daily.     rOPINIRole (REQUIP) 4 MG tablet Take by mouth.     thiamine (VITAMIN B-1) 100 MG tablet Take 100 mg by mouth daily.     TURMERIC PO Take by mouth.     vitamin B-12 (CYANOCOBALAMIN) 500 MCG tablet Take 500 mcg by mouth daily.     vitamin E 400 UNIT capsule Take 400 Units by mouth daily.     cetirizine (ZYRTEC) 5 MG tablet Take 1 tablet (5 mg total) by mouth daily. (Patient not taking: Reported on 08/03/2021) 30 tablet 0   No facility-administered medications prior to visit.    Review of Systems  Constitutional:  Negative for chills, fever, malaise/fatigue and weight loss.  HENT:  Negative for hearing loss, sore throat and tinnitus.   Eyes:  Negative for blurred vision and double vision.  Respiratory:  Negative for cough, hemoptysis, sputum production, shortness of breath, wheezing and stridor.    Cardiovascular:  Negative for chest pain, palpitations, orthopnea, leg swelling and PND.  Gastrointestinal:  Negative for abdominal pain, constipation, diarrhea, heartburn, nausea and vomiting.  Genitourinary:  Negative for dysuria, hematuria and urgency.  Musculoskeletal:  Negative for joint pain and myalgias.  Skin:  Negative for itching and rash.  Neurological:  Negative for dizziness, tingling, weakness and headaches.  Endo/Heme/Allergies:  Negative for environmental allergies. Does not bruise/bleed easily.  Psychiatric/Behavioral:  Negative for depression. The patient is not nervous/anxious and does not have insomnia.   All other systems reviewed and are negative.   Objective:  Physical Exam Vitals reviewed.  Constitutional:      General: She is not in acute distress.    Appearance: She is well-developed. She is obese.  HENT:     Head: Normocephalic and atraumatic.  Eyes:     General: No scleral icterus.    Conjunctiva/sclera: Conjunctivae normal.     Pupils: Pupils are equal, round, and reactive to light.  Neck:     Vascular: No JVD.     Trachea: No tracheal deviation.  Cardiovascular:     Rate and Rhythm: Normal rate and regular rhythm.     Heart sounds: Normal heart sounds. No murmur heard. Pulmonary:     Effort: Pulmonary effort is normal. No tachypnea, accessory muscle usage or respiratory distress.     Breath sounds: No stridor. No wheezing, rhonchi or rales.  Abdominal:     General: Bowel sounds are normal. There is no distension.     Palpations: Abdomen is soft.     Tenderness: There is no abdominal tenderness.  Musculoskeletal:        General: No tenderness.     Cervical back: Neck supple.  Lymphadenopathy:     Cervical: No cervical adenopathy.  Skin:    General: Skin is warm and dry.     Capillary Refill: Capillary refill takes less than 2 seconds.     Findings: No rash.  Neurological:     Mental Status: She is alert and oriented to person, place, and  time.  Psychiatric:        Behavior: Behavior normal.     Vitals:   08/03/21 1500  BP: 120/82  Pulse: 66  Temp: 98.1 F (36.7 C)  TempSrc: Oral  SpO2: 95%  Weight: 194 lb 12.8 oz (88.4 kg)  Height: 5\' 4"  (1.626 m)   95% on RA BMI Readings from Last 3 Encounters:  08/03/21 33.44 kg/m  07/10/21 34.40 kg/m  03/26/21 33.37 kg/m  Wt Readings from Last 3 Encounters:  08/03/21 194 lb 12.8 oz (88.4 kg)  07/10/21 200 lb 6.4 oz (90.9 kg)  03/26/21 194 lb 6.4 oz (88.2 kg)     CBC    Component Value Date/Time   WBC 5.9 08/22/2007 0934   RBC 4.71 08/22/2007 0934   HGB 14.8 08/22/2007 0934   HCT 42.9 08/22/2007 0934   PLT 298 08/22/2007 0934   MCV 91.1 08/22/2007 0934   MCHC 34.5 08/22/2007 0934   RDW 12.3 08/22/2007 0934    Chest Imaging: October CT chest: Lung cancer screening CT 4B, new pulmonary nodule.  Nuclear medicine pet imaging October [id number]: No hypermetabolic uptake in the left lower lobe pulmonary nodule. Imaging reports reviewed in epic care everywhere, was completed at [place].  Pulmonary Functions Testing Results: No flowsheet data found.  FeNO:   Pathology:   Echocardiogram:   Heart Catheterization:     Assessment & Plan:     ICD-10-CM   1. Lung nodule  R91.1 CT Super D Chest Wo Contrast    2. Abnormal CT lung screening  R91.8     3. Former smoker  Z87.891       Discussion:  This is a 70 year old female, history of tobacco abuse, enrolled in lung cancer screening program at [facility].  Had a a new 13 mm left lower lobe pulmonary nodule.  Had a nuclear medicine PET scan which revealed no significant hypermetabolic uptake.  We discussed all of the various options to include robotic assisted navigational bronchoscopy with tissue sampling versus conservative image follow-up.  Plan: Patient would prefer to wait and see if this nodule changes before making any decisions. I discussed case with patient's primary pulmonologist Dr. Annamaria Boots  today in the office. We will plan for a noncontrasted CT scan of the chest in 6 months. If the nodule grows or change we could consider robotic assisted navigation with tissue sampling.  Additional time spent reviewing recommendations from [place] tumor board documented in care everywhere.   Current Outpatient Medications:    Ascorbic Acid (VITAMIN C) 100 MG tablet, Take 100 mg by mouth daily., Disp: , Rfl:    aspirin 81 MG tablet, Take 81 mg by mouth daily., Disp: , Rfl:    Calcium Carbonate (CALCIUM 600 PO), Take by mouth., Disp: , Rfl:    Cholecalciferol (VITAMIN D3) 2000 UNITS TABS, Take by mouth., Disp: , Rfl:    fish oil-omega-3 fatty acids 1000 MG capsule, Take 2 g by mouth daily., Disp: , Rfl:    Melatonin 3 MG CAPS, Take by mouth., Disp: , Rfl:    Misc Natural Products (TART CHERRY ADVANCED PO), Take 2,500 Units by mouth daily., Disp: , Rfl:    pravastatin (PRAVACHOL) 10 MG tablet, Take 10 mg by mouth daily., Disp: , Rfl:    rOPINIRole (REQUIP) 4 MG tablet, Take by mouth., Disp: , Rfl:    thiamine (VITAMIN B-1) 100 MG tablet, Take 100 mg by mouth daily., Disp: , Rfl:    TURMERIC PO, Take by mouth., Disp: , Rfl:    vitamin B-12 (CYANOCOBALAMIN) 500 MCG tablet, Take 500 mcg by mouth daily., Disp: , Rfl:    vitamin E 400 UNIT capsule, Take 400 Units by mouth daily., Disp: , Rfl:    cetirizine (ZYRTEC) 5 MG tablet, Take 1 tablet (5 mg total) by mouth daily. (Patient not taking: Reported on 08/03/2021), Disp: 30 tablet, Rfl: 0  I spent 42 minutes dedicated to the care of this patient  on the date of this encounter to include pre-visit review of records, face-to-face time with the patient discussing conditions above, post visit ordering of testing, clinical documentation with the electronic health record, making appropriate referrals as documented, and communicating necessary findings to members of the patients care team.   Garner Nash, DO [facility] 08/03/2021 3:18 PM

## 2021-08-03 NOTE — Patient Instructions (Signed)
Thank you for visiting Dr. Tonia Brooms at [facility]. Today we recommend the following:  Orders Placed This Encounter  Procedures   CT Super D Chest Wo Contrast    Return in about 6 months (around 01/31/2022) for with Kandice Robinsons, NP, or Dr. Tonia Brooms. After CT chest    Please do your part to reduce the spread of COVID-19.

## 2021-08-11 ENCOUNTER — Other Ambulatory Visit: Payer: Self-pay

## 2021-08-11 ENCOUNTER — Ambulatory Visit
Admission: RE | Admit: 2021-08-11 | Discharge: 2021-08-11 | Disposition: A | Discharge status: Home / Self Care | Payer: Medicare Other | Source: Ambulatory Visit | Attending: Family Medicine | Admitting: Family Medicine

## 2021-08-11 DIAGNOSIS — Z1231 Encounter for screening mammogram for malignant neoplasm of breast: Secondary | ICD-10-CM

## 2021-09-30 DIAGNOSIS — I739 Peripheral vascular disease, unspecified: Secondary | ICD-10-CM | POA: Insufficient documentation

## 2021-11-04 ENCOUNTER — Encounter: Payer: Self-pay | Admitting: Internal Medicine

## 2021-11-04 NOTE — Assessment & Plan Note (Signed)
Presume this reflects obesity-hypoventilation and COPD. Plan- emphasis on smoking cessation. Overnight oximetry on room air to qualify her for home O2.

## 2021-11-04 NOTE — Assessment & Plan Note (Signed)
Discussed availability of pharmacy smoking cessation program. She was not interested now but will consider.

## 2021-12-24 ENCOUNTER — Ambulatory Visit: Payer: Medicare Other | Admitting: Pulmonary Disease

## 2021-12-24 NOTE — Progress Notes (Incomplete)
? ?Synopsis: Referred in lung nodule for November 2022 by Tracey HarriesBouska, David, MD ? ?Subjective:  ? ?PATIENT ID: Margaret RankinsVickie D Casey GENDER: female DOB: 06-Sep-1951, MRN: [id number] ? ?No chief complaint on file. ? ? ?This is a 71 year old female, past medical history of hyperlipidemia, obesity, restless leg syndrome.  Patient has establish and follow-up care with Dr. Maple HudsonYoung here in the office for some time.  She had a low-dose lung cancer screening CT that was completed at [facility] this past fall in the beginning of October.  This revealed a new 13 mm nodule.  She is ultimately had a PET scan that was completed a few weeks ago also at the end of October.  This PET scan showed no hypermetabolic uptake within the nodule.  The nodule itself [facility].  This was attended by thoracic surgery pulmonary and medical oncology.  This recommendation was for consideration of navigational bronchoscopy and tissue biopsy.  I discussed the recommendations from [facility] as well as reviewed patient's images today in the office with her.  Is apprehensive about consideration for tissue biopsy at this time. ? ? ?Past Medical History:  ?Diagnosis Date  ?? Allergy   ?? Gout   ?? Hyperlipidemia   ?? Obesity   ?? Restless leg syndrome   ?  ? ?Family History  ?Problem Relation Age of Onset  ?? Heart disease Father   ?? Hypertension Father   ?  ? ?Past Surgical History:  ?Procedure Laterality Date  ?? ABDOMINAL HYSTERECTOMY    ?? CHOLECYSTECTOMY    ? ? ?Social History  ? ?Socioeconomic History  ?? Marital status: Married  ?  Spouse name: Not on file  ?? Number of children: Not on file  ?? Years of education: Not on file  ?? Highest education level: Not on file  ?Occupational History  ?? Occupation: MidwifeBus Driver  ?Tobacco Use  ?? Smoking status: Every Day  ?  Packs/day: 1.00  ?  Types: Cigarettes  ?? Smokeless tobacco: Never  ?Vaping Use  ?? Vaping Use: Never used  ?Substance and Sexual  Activity  ?? Alcohol use: No  ?? Drug use: No  ?? Sexual activity: Not on file  ?Other Topics Concern  ?? Not on file  ?Social History Narrative  ?? Not on file  ? ?Social Determinants of Health  ? ?Financial Resource Strain: Not on file  ?Food Insecurity: Not on file  ?Transportation Needs: Not on file  ?Physical Activity: Not on file  ?Stress: Not on file  ?Social Connections: Not on file  ?Intimate Partner Violence: Not on file  ?  ? ?Allergies  ?Allergen Reactions  ?? Hydrocodone   ?? Latex   ?? Prednisone Hives and Swelling  ?  ? ?Outpatient Medications Prior to Visit  ?Medication Sig Dispense Refill  ?? Ascorbic Acid (VITAMIN C) 100 MG tablet Take 100 mg by mouth daily.    ?? aspirin 81 MG tablet Take 81 mg by mouth daily.    ?? Calcium Carbonate (CALCIUM 600 PO) Take by mouth.    ?? cetirizine (ZYRTEC) 5 MG tablet Take 1 tablet (5 mg total) by mouth daily. (Patient not taking: Reported on 08/03/2021) 30 tablet 0  ?? Cholecalciferol (VITAMIN D3) 2000 UNITS TABS Take by mouth.    ?? fish oil-omega-3 fatty acids 1000 MG capsule Take 2 g by mouth daily.    ?? Melatonin 3 MG CAPS Take by mouth.    ?? Misc  Natural Products (TART CHERRY ADVANCED PO) Take 2,500 Units by mouth daily.    ?? pravastatin (PRAVACHOL) 10 MG tablet Take 10 mg by mouth daily.    ?? rOPINIRole (REQUIP) 4 MG tablet Take by mouth.    ?? thiamine (VITAMIN B-1) 100 MG tablet Take 100 mg by mouth daily.    ?? TURMERIC PO Take by mouth.    ?? vitamin B-12 (CYANOCOBALAMIN) 500 MCG tablet Take 500 mcg by mouth daily.    ?? vitamin E 400 UNIT capsule Take 400 Units by mouth daily.    ? ?No facility-administered medications prior to visit.  ? ? ?Review of Systems  ?Constitutional:  Negative for chills, fever, malaise/fatigue and weight loss.  ?HENT:  Negative for hearing loss, sore throat and tinnitus.   ?Eyes:  Negative for blurred vision and double vision.  ?Respiratory:  Negative for cough, hemoptysis, sputum production, shortness of breath,  wheezing and stridor.   ?Cardiovascular:  Negative for chest pain, palpitations, orthopnea, leg swelling and PND.  ?Gastrointestinal:  Negative for abdominal pain, constipation, diarrhea, heartburn, nausea and vomiting.  ?Genitourinary:  Negative for dysuria, hematuria and urgency.  ?Musculoskeletal:  Negative for joint pain and myalgias.  ?Skin:  Negative for itching and rash.  ?Neurological:  Negative for dizziness, tingling, weakness and headaches.  ?Endo/Heme/Allergies:  Negative for environmental allergies. Does not bruise/bleed easily.  ?Psychiatric/Behavioral:  Negative for depression. The patient is not nervous/anxious and does not have insomnia.   ?All other systems reviewed and are negative. ? ? ?Objective:  ?Physical Exam ?Vitals reviewed.  ?Constitutional:   ?   General: She is not in acute distress. ?   Appearance: She is well-developed. She is obese.  ?HENT:  ?   Head: Normocephalic and atraumatic.  ?Eyes:  ?   General: No scleral icterus. ?   Conjunctiva/sclera: Conjunctivae normal.  ?   Pupils: Pupils are equal, round, and reactive to light.  ?Neck:  ?   Vascular: No JVD.  ?   Trachea: No tracheal deviation.  ?Cardiovascular:  ?   Rate and Rhythm: Normal rate and regular rhythm.  ?   Heart sounds: Normal heart sounds. No murmur heard. ?Pulmonary:  ?   Effort: Pulmonary effort is normal. No tachypnea, accessory muscle usage or respiratory distress.  ?   Breath sounds: No stridor. No wheezing, rhonchi or rales.  ?Abdominal:  ?   General: Bowel sounds are normal. There is no distension.  ?   Palpations: Abdomen is soft.  ?   Tenderness: There is no abdominal tenderness.  ?Musculoskeletal:     ?   General: No tenderness.  ?   Cervical back: Neck supple.  ?Lymphadenopathy:  ?   Cervical: No cervical adenopathy.  ?Skin: ?   General: Skin is warm and dry.  ?   Capillary Refill: Capillary refill takes less than 2 seconds.  ?   Findings: No rash.  ?Neurological:  ?   Mental Status: She is alert and oriented  to person, place, and time.  ?Psychiatric:     ?   Behavior: Behavior normal.  ? ? ? ?There were no vitals filed for this visit. ? ?  on RA ?BMI Readings from Last 3 Encounters:  ?08/03/21 33.44 kg/m?  ?07/10/21 34.40 kg/m?  ?03/26/21 33.37 kg/m?  ? ?Wt Readings from Last 3 Encounters:  ?08/03/21 194 lb 12.8 oz (88.4 kg)  ?07/10/21 200 lb 6.4 oz (90.9 kg)  ?03/26/21 194 lb 6.4 oz (88.2 kg)  ? ? ? ?  CBC ?   ?Component Value Date/Time  ? WBC 5.9 08/22/2007 0934  ? RBC 4.71 08/22/2007 0934  ? HGB 14.8 08/22/2007 0934  ? HCT 42.9 08/22/2007 0934  ? PLT 298 08/22/2007 0934  ? MCV 91.1 08/22/2007 0934  ? MCHC 34.5 08/22/2007 0934  ? RDW 12.3 08/22/2007 0934  ? ? ?Chest Imaging: ?October CT chest: ?Lung cancer screening CT 4B, new pulmonary nodule. ? ?Nuclear medicine pet imaging October 2022: ?No hypermetabolic uptake in the left lower lobe pulmonary nodule. ?Imaging reports reviewed in epic care everywhere, was completed at [facility] health. ? ?Pulmonary Functions Testing Results: ?   ? View : No data to display.  ?  ?  ?  ? ? ?FeNO:  ? ?Pathology:  ? ?Echocardiogram:  ? ?Heart Catheterization:  ?   ?Assessment & Plan:  ? ?No diagnosis found. ? ? ?Discussion: ? ?This is a 71 year old female, history of tobacco abuse, enrolled in lung cancer screening program at [facility].  Had a a new 13 mm left lower lobe pulmonary nodule.  Had a nuclear medicine PET scan which revealed no significant hypermetabolic uptake. ? ?We discussed all of the various options to include robotic assisted navigational bronchoscopy with tissue sampling versus conservative image follow-up. ? ?Plan: ?Patient would prefer to wait and see if this nodule changes before making any decisions. ?I discussed case with patient's primary pulmonologist Dr. Maple Hudson today in the office. ?We will plan for a noncontrasted CT scan of the chest in 6 months. ?If the nodule grows or change we could consider robotic assisted navigation with tissue sampling. ? ?Additional time  spent reviewing recommendations from [facility] tumor board documented in care everywhere. ? ? ?Current Outpatient Medications:  ??  Ascorbic Acid (VITAMIN C) 100 MG tablet, Take 100 mg by mouth daily., Disp: , Rf

## 2022-01-18 ENCOUNTER — Encounter: Payer: Self-pay | Admitting: Pulmonary Disease

## 2022-01-18 ENCOUNTER — Ambulatory Visit (INDEPENDENT_AMBULATORY_CARE_PROVIDER_SITE_OTHER)
Admission: RE | Admit: 2022-01-18 | Discharge: 2022-01-18 | Disposition: A | Discharge status: Home / Self Care | Payer: Medicare Other | Source: Ambulatory Visit | Attending: Pulmonary Disease | Admitting: Pulmonary Disease

## 2022-01-18 ENCOUNTER — Ambulatory Visit: Payer: Medicare Other | Admitting: Pulmonary Disease

## 2022-01-18 VITALS — BP 144/74 | HR 63 | Temp 97.9°F | Ht 64.0 in | Wt 196.0 lb

## 2022-01-18 DIAGNOSIS — R911 Solitary pulmonary nodule: Secondary | ICD-10-CM

## 2022-01-18 DIAGNOSIS — Z87891 Personal history of nicotine dependence: Secondary | ICD-10-CM

## 2022-01-18 DIAGNOSIS — R918 Other nonspecific abnormal finding of lung field: Secondary | ICD-10-CM | POA: Diagnosis not present

## 2022-01-18 NOTE — Progress Notes (Signed)
Synopsis: Referred in lung nodule for November 2022 by Tracey Harries, MD  Subjective:   PATIENT ID: Margaret Casey GENDER: female DOB: 10/24/1950, MRN: [id number]  Chief Complaint  Patient presents with   Follow-up    Follow up. Patient has no complaints.     This is a 71 year old female, past medical history of hyperlipidemia, obesity, restless leg syndrome.  Patient has establish and follow-up care with Dr. Maple Hudson here in the office for some time.  She had a low-dose lung cancer screening CT that was completed at [facility] this past fall in the beginning of October.  This revealed a new 13 mm nodule.  She is ultimately had a PET scan that was completed a few weeks ago also at the end of October.  This PET scan showed no hypermetabolic uptake within the nodule.  The nodule itself was reviewed at the [facility] rapid lung nodule review clinic.  This was attended by thoracic surgery pulmonary and medical oncology.  This recommendation was for consideration of navigational bronchoscopy and tissue biopsy.  I discussed the recommendations from [facility] as well as reviewed patient's images today in the office with her.  Is apprehensive about consideration for tissue biopsy at this time.  OV 01/18/2022: Here today for follow-up after CT chest.  CT chest shows stability of her 11 x 13 mm left upper lobe proximal lung nodule.  Smooth marginated.  Possible small area of fat in it consistent with hamartoma.  She has not had nuclear medicine pet imaging on this.  Otherwise no significant respiratory complaints.  We reviewed her follow-up CT images today in detail in the office.   Past Medical History:  Diagnosis Date   Allergy    Gout    Hyperlipidemia    Obesity    Restless leg syndrome      Family History  Problem Relation Age of Onset   Heart disease Father    Hypertension Father      Past Surgical History:  Procedure Laterality Date   ABDOMINAL HYSTERECTOMY     CHOLECYSTECTOMY       Social History   Socioeconomic History   Marital status: Married    Spouse name: Not on file   Number of children: Not on file   Years of education: Not on file   Highest education level: Not on file  Occupational History   Occupation: Midwife  Tobacco Use   Smoking status: Former    Packs/day: 1.00    Types: Cigarettes   Smokeless tobacco: Former  Building services engineer Use: Never used  Substance and Sexual Activity   Alcohol use: No   Drug use: No   Sexual activity: Not on file  Other Topics Concern   Not on file  Social History Narrative   Not on file   Social Determinants of Health   Financial Resource Strain: Not on file  Food Insecurity: Not on file  Transportation Needs: Not on file  Physical Activity: Not on file  Stress: Not on file  Social Connections: Not on file  Intimate Partner Violence: Not on file     Allergies  Allergen Reactions   Hydrocodone    Latex    Prednisone Hives and Swelling     Outpatient Medications Prior to Visit  Medication Sig Dispense Refill   Ascorbic Acid (VITAMIN C) 100 MG tablet Take 100 mg by mouth daily.     aspirin 81 MG tablet Take 81 mg by  mouth daily.     Calcium Carbonate (CALCIUM 600 PO) Take by mouth.     Cholecalciferol (VITAMIN D3) 2000 UNITS TABS Take by mouth.     fish oil-omega-3 fatty acids 1000 MG capsule Take 2 g by mouth daily.     Melatonin 3 MG CAPS Take by mouth.     Misc Natural Products (TART CHERRY ADVANCED PO) Take 2,500 Units by mouth daily.     pravastatin (PRAVACHOL) 10 MG tablet Take 10 mg by mouth daily.     rOPINIRole (REQUIP) 4 MG tablet Take by mouth.     thiamine (VITAMIN B-1) 100 MG tablet Take 100 mg by mouth daily.     TURMERIC PO Take by mouth.     vitamin B-12 (CYANOCOBALAMIN) 500 MCG tablet Take 500 mcg by mouth daily.     vitamin E 400 UNIT capsule Take 400 Units by mouth daily.     cetirizine (ZYRTEC) 5 MG tablet Take 1 tablet (5 mg total) by mouth daily. (Patient not  taking: Reported on 08/03/2021) 30 tablet 0   No facility-administered medications prior to visit.    Review of Systems  Constitutional:  Negative for chills, fever, malaise/fatigue and weight loss.  HENT:  Negative for hearing loss, sore throat and tinnitus.   Eyes:  Negative for blurred vision and double vision.  Respiratory:  Negative for cough, hemoptysis, sputum production, shortness of breath, wheezing and stridor.   Cardiovascular:  Negative for chest pain, palpitations, orthopnea, leg swelling and PND.  Gastrointestinal:  Negative for abdominal pain, constipation, diarrhea, heartburn, nausea and vomiting.  Genitourinary:  Negative for dysuria, hematuria and urgency.  Musculoskeletal:  Negative for joint pain and myalgias.  Skin:  Negative for itching and rash.  Neurological:  Negative for dizziness, tingling, weakness and headaches.  Endo/Heme/Allergies:  Negative for environmental allergies. Does not bruise/bleed easily.  Psychiatric/Behavioral:  Negative for depression. The patient is not nervous/anxious and does not have insomnia.   All other systems reviewed and are negative.   Objective:  Physical Exam Vitals reviewed.  Constitutional:      General: She is not in acute distress.    Appearance: She is well-developed. She is obese.  HENT:     Head: Normocephalic and atraumatic.  Eyes:     General: No scleral icterus.    Conjunctiva/sclera: Conjunctivae normal.     Pupils: Pupils are equal, round, and reactive to light.  Neck:     Vascular: No JVD.     Trachea: No tracheal deviation.  Cardiovascular:     Rate and Rhythm: Normal rate and regular rhythm.     Heart sounds: Normal heart sounds. No murmur heard. Pulmonary:     Effort: Pulmonary effort is normal. No tachypnea, accessory muscle usage or respiratory distress.     Breath sounds: No stridor. No wheezing, rhonchi or rales.  Abdominal:     General: There is no distension.     Palpations: Abdomen is soft.      Tenderness: There is no abdominal tenderness.  Musculoskeletal:        General: No tenderness.     Cervical back: Neck supple.  Lymphadenopathy:     Cervical: No cervical adenopathy.  Skin:    General: Skin is warm and dry.     Capillary Refill: Capillary refill takes less than 2 seconds.     Findings: No rash.  Neurological:     Mental Status: She is alert and oriented to person, place, and time.  Psychiatric:  Behavior: Behavior normal.     Vitals:   01/18/22 1453  BP: (!) 144/74  Pulse: 63  Temp: 97.9 F (36.6 C)  TempSrc: Oral  SpO2: 98%  Weight: 196 lb (88.9 kg)  Height: 5\' 4"  (1.626 m)   98% on RA BMI Readings from Last 3 Encounters:  01/18/22 33.64 kg/m  08/03/21 33.44 kg/m  07/10/21 34.40 kg/m   Wt Readings from Last 3 Encounters:  01/18/22 196 lb (88.9 kg)  08/03/21 194 lb 12.8 oz (88.4 kg)  07/10/21 200 lb 6.4 oz (90.9 kg)     CBC    Component Value Date/Time   WBC 5.9 08/22/2007 0934   RBC 4.71 08/22/2007 0934   HGB 14.8 08/22/2007 0934   HCT 42.9 08/22/2007 0934   PLT 298 08/22/2007 0934   MCV 91.1 08/22/2007 0934   MCHC 34.5 08/22/2007 0934   RDW 12.3 08/22/2007 0934    Chest Imaging: October CT chest: Lung cancer screening CT 4B, new pulmonary nodule.  Nuclear medicine pet imaging October 2022: No hypermetabolic uptake in the left lower lobe pulmonary nodule. Imaging reports reviewed in epic care everywhere, was completed at [facility] health.  CT chest 01/18/2022: Stable 11 x 13 mm pulmonary nodule. Possible small central area of fat consistent with a hamartoma. The patient's images have been independently reviewed by me.    Pulmonary Functions Testing Results:     View : No data to display.          FeNO:   Pathology:   Echocardiogram:   Heart Catheterization:     Assessment & Plan:     ICD-10-CM   1. Lung nodule  R91.1 NM PET Image Initial (PI) Skull Base To Thigh (F-18 FDG)    2. Abnormal CT lung screening   R91.8     3. Former smoker  Z87.891       Discussion:  This is a 71 year old female, former history of tobacco use, enrolled in lung cancer screening program at [facility].  Found to have a new 13 mm left lower lobe pulmonary nodule.  Had a nuclear medicine PET scan which revealed no significant hypermetabolic uptake.  We had ongoing planned CT follow-up.  Plan: We had a follow-up CT and there was no significant change. She would like to continue to follow the nodule before making any decisions regarding tissue biopsy or doing anything for it. Unless it changes I do not think that she is can be very interested in doing anything or unless the PET scan changes are probability of malignancy. We will have a repeat PET scan completed in approximately 4 months and follow-up with [place] then. Patient is agreeable to this plan.   Current Outpatient Medications:    Ascorbic Acid (VITAMIN C) 100 MG tablet, Take 100 mg by mouth daily., Disp: , Rfl:    aspirin 81 MG tablet, Take 81 mg by mouth daily., Disp: , Rfl:    Calcium Carbonate (CALCIUM 600 PO), Take by mouth., Disp: , Rfl:    Cholecalciferol (VITAMIN D3) 2000 UNITS TABS, Take by mouth., Disp: , Rfl:    fish oil-omega-3 fatty acids 1000 MG capsule, Take 2 g by mouth daily., Disp: , Rfl:    Melatonin 3 MG CAPS, Take by mouth., Disp: , Rfl:    Misc Natural Products (TART CHERRY ADVANCED PO), Take 2,500 Units by mouth daily., Disp: , Rfl:    pravastatin (PRAVACHOL) 10 MG tablet, Take 10 mg by mouth daily., Disp: , Rfl:  rOPINIRole (REQUIP) 4 MG tablet, Take by mouth., Disp: , Rfl:    thiamine (VITAMIN B-1) 100 MG tablet, Take 100 mg by mouth daily., Disp: , Rfl:    TURMERIC PO, Take by mouth., Disp: , Rfl:    vitamin B-12 (CYANOCOBALAMIN) 500 MCG tablet, Take 500 mcg by mouth daily., Disp: , Rfl:    vitamin E 400 UNIT capsule, Take 400 Units by mouth daily., Disp: , Rfl:    cetirizine (ZYRTEC) 5 MG tablet, Take 1 tablet (5 mg total) by mouth daily.  (Patient not taking: Reported on 08/03/2021), Disp: 30 tablet, Rfl: 0   Josephine Igo, DO [facility] 01/18/2022 3:08 PM

## 2022-01-18 NOTE — Patient Instructions (Signed)
Thank you for visiting Dr. Tonia Brooms at [facility]. ?Today we recommend the following: ? ?Orders Placed This Encounter  ?Procedures  ? NM PET Image Initial (PI) Skull Base To Thigh (F-18 FDG)  ? ?Return in about 4 months (around 05/21/2022) for with Kandice Robinsons, NP, or Dr. Tonia Brooms. ? ? ? ?Please do your part to reduce the spread of COVID-19.  ? ?

## 2022-02-16 ENCOUNTER — Other Ambulatory Visit (HOSPITAL_BASED_OUTPATIENT_CLINIC_OR_DEPARTMENT_OTHER): Payer: Self-pay

## 2022-02-16 ENCOUNTER — Ambulatory Visit: Payer: Medicare Other | Attending: Internal Medicine

## 2022-02-16 DIAGNOSIS — Z23 Encounter for immunization: Secondary | ICD-10-CM

## 2022-02-16 MED ORDER — PFIZER COVID-19 VAC BIVALENT 30 MCG/0.3ML IM SUSP
INTRAMUSCULAR | 0 refills | Status: AC
  Filled 2022-02-16: qty 0.3, 1d supply, fill #0

## 2022-02-16 NOTE — Progress Notes (Signed)
   Covid-19 Vaccination Clinic  Name:  Margaret Casey    MRN: [id number] DOB: [birth year]  02/16/2022  [name] was observed post Covid-19 immunization for 15 minutes without incident. She was provided with Vaccine Information Sheet and instruction to access the V-Safe system.   [name] was instructed to call 911 with any severe reactions post vaccine: Difficulty breathing  Swelling of face and throat  A fast heartbeat  A bad rash all over body  Dizziness and weakness   Immunizations Administered     Name Date Dose VIS Date Route   Pfizer Covid-19 Vaccine Bivalent Booster 02/16/2022 10:15 AM 0.3 mL 05/20/2021 Intramuscular   Manufacturer: [employer], [employer]   Lot: [id number]   NDC: [phone]

## 2022-05-12 ENCOUNTER — Telehealth: Payer: Self-pay | Admitting: Pulmonary Disease

## 2022-05-12 ENCOUNTER — Ambulatory Visit (HOSPITAL_COMMUNITY): Payer: Medicare Other

## 2022-05-12 ENCOUNTER — Encounter (HOSPITAL_COMMUNITY): Payer: Self-pay

## 2022-05-12 DIAGNOSIS — R911 Solitary pulmonary nodule: Secondary | ICD-10-CM

## 2022-05-12 NOTE — Telephone Encounter (Signed)
-----   Message from Antionette Fairy sent at 05/11/2022  4:58 PM EDT ----- Regarding: RE: PET DENIAL I can do that - will you please place an order for that?   Thanks,  Jeanice Lim  ----- Message ----- From: Josephine Igo, DO Sent: 05/11/2022   3:57 PM EDT To: Antionette Fairy Subject: RE: PET DENIAL                                 Cancel pet  Just get a repeat Super D CT Chest   Thanks  Brad    ----- Message ----- From: Antionette Fairy Sent: 05/10/2022   3:55 PM EDT To: Josephine Igo, DO Subject: PET DENIAL                                     Hey, Dr. Tonia Brooms.   Pt's insurance is denying the PET for the following reasons:   Date: 05/09/2022 Urgent Action Needed: Information needed in order to approve  request for: Member: Margaret Casey Member number: [id number] Case Reference #: [id number] Respond: Before 05/15/2022 Prior to issuing an Adverse Determination, we're offering an opportunity for your office to provide the clinical information that demonstrates this request is  medically necessary. Please note that you must provide the clinical information Before 05/15/2022. Please call [phone], select Option # 1, and enter in Reference Number [id number] to speak with a clinician about this request.  If you'd rather engage in a Peer-to-Peer discussion, this discussion must take place Before 05/15/2022. If you wish to schedule a Peer-to-Peer discussion, please call [phone], select Option # 3, and enter in Reference Number [id number]. This fax is to inform you that our Medical Director has reviewed your request for [facility], for procedure [phone] - PET/CT Scan from head to thigh to evaluate tumor. Our Medical Director is unable to approve the request for the member because: Based on Medicare National Coverage Determinations (NCD): 220.6.17 Positron Emission Tomography (FDG) for Oncologic Conditions, we cannot approve this request. Your healthcare provider told us that there is a  concern related to the cells in your body that are not normal. The request cannot be approved because: Your records show that you have had a detailed picture study to look at your organ and tissue function (positron emission tomography or PET) to help plan your treatment. Repeat PET scans are not supported for treatment planning. NOTE: Once an adverse determination letter has been issued, per Medicare rules the physician who made the adverse determination cannot reverse the original decision. Instead, providers will have to appeal the original decision in accordance with the appeal instructions set forth in the adverse determination letter.  Are you able to complete a peer to peer for this?   Thanks,  [employer]

## 2022-05-12 NOTE — Telephone Encounter (Signed)
PCCM  CT chest orders placed  Josephine Igo, DO [facility] 05/12/2022 12:51 PM

## 2022-05-18 ENCOUNTER — Ambulatory Visit (HOSPITAL_COMMUNITY)
Admission: RE | Admit: 2022-05-18 | Discharge: 2022-05-18 | Disposition: A | Discharge status: Home / Self Care | Payer: Medicare Other | Source: Ambulatory Visit | Attending: Pulmonary Disease | Admitting: Pulmonary Disease

## 2022-05-18 DIAGNOSIS — R911 Solitary pulmonary nodule: Secondary | ICD-10-CM | POA: Insufficient documentation

## 2022-05-19 ENCOUNTER — Ambulatory Visit: Payer: Medicare Other | Admitting: Pulmonary Disease

## 2022-05-19 ENCOUNTER — Encounter: Payer: Self-pay | Admitting: Pulmonary Disease

## 2022-05-19 VITALS — BP 120/68 | HR 64 | Temp 98.0°F | Ht 64.0 in | Wt 198.4 lb

## 2022-05-19 DIAGNOSIS — R911 Solitary pulmonary nodule: Secondary | ICD-10-CM | POA: Diagnosis not present

## 2022-05-19 DIAGNOSIS — R918 Other nonspecific abnormal finding of lung field: Secondary | ICD-10-CM | POA: Diagnosis not present

## 2022-05-19 DIAGNOSIS — Z87891 Personal history of nicotine dependence: Secondary | ICD-10-CM

## 2022-05-19 NOTE — Progress Notes (Signed)
Synopsis: Referred in lung nodule for November 2022 by Tracey Harries, MD  Subjective:   PATIENT ID: Margaret Casey GENDER: female DOB: 05/12/1951, MRN: [id number]  Chief Complaint  Patient presents with   Follow-up    Pt is here to go over CT scan results.     This is a 71 year old female, past medical history of hyperlipidemia, obesity, restless leg syndrome.  Patient has establish and follow-up care with Dr. Maple Hudson here in the office for some time.  She had a low-dose lung cancer screening CT that was completed at [facility] this past fall in the beginning of October.  This revealed a new 13 mm nodule.  She is ultimately had a PET scan that was completed a few weeks ago also at the end of October.  This PET scan showed no hypermetabolic uptake within the nodule.  The nodule itself was reviewed at the [facility] health rapid lung nodule review clinic.  This was attended by thoracic surgery pulmonary and medical oncology.  This recommendation was for consideration of navigational bronchoscopy and tissue biopsy.  I discussed the recommendations from [facility] as well as reviewed patient's images today in the office with her.  Is apprehensive about consideration for tissue biopsy at this time.  OV 01/18/2022: Here today for follow-up after CT chest.  CT chest shows stability of her 11 x 13 mm left upper lobe proximal lung nodule.  Smooth marginated.  Possible small area of fat in it consistent with hamartoma.  She has not had nuclear medicine pet imaging on this.  Otherwise no significant respiratory complaints.  We reviewed her follow-up CT images today in detail in the office.  OV 05/19/2022: Here today for follow-up regarding CT imaging.Patient had CT scan of the chest completed yesterday.  Initially she was going to have a follow-up nuclear medicine pet image but we could not get this approved with insurance therefore we moved ahead with just a repeat noncontrasted CT chest patient's lung nodule  from CT chest looks approximately the same.  This was from a follow-up in May 2023.  Under CT surveillance there appears to be some fatty change within the center of the nodule suggestive of a hamartoma.  Patient with no complaints today.  Reviewed her CT imaging.    Past Medical History:  Diagnosis Date   Allergy    Gout    Hyperlipidemia    Obesity    Restless leg syndrome      Family History  Problem Relation Age of Onset   Heart disease Father    Hypertension Father      Past Surgical History:  Procedure Laterality Date   ABDOMINAL HYSTERECTOMY     CHOLECYSTECTOMY      Social History   Socioeconomic History   Marital status: Married    Spouse name: Not on file   Number of children: Not on file   Years of education: Not on file   Highest education level: Not on file  Occupational History   Occupation: Midwife  Tobacco Use   Smoking status: Former    Packs/day: 1.00    Types: Cigarettes   Smokeless tobacco: Former  Building services engineer Use: Never used  Substance and Sexual Activity   Alcohol use: No   Drug use: No   Sexual activity: Not on file  Other Topics Concern   Not on file  Social History Narrative   Not on file   Social Determinants of Health  Financial Resource Strain: Not on file  Food Insecurity: Not on file  Transportation Needs: Not on file  Physical Activity: Not on file  Stress: Not on file  Social Connections: Not on file  Intimate Partner Violence: Not on file     Allergies  Allergen Reactions   Hydrocodone    Latex    Prednisone Hives and Swelling     Outpatient Medications Prior to Visit  Medication Sig Dispense Refill   Ascorbic Acid (VITAMIN C) 100 MG tablet Take 100 mg by mouth daily.     aspirin 81 MG tablet Take 81 mg by mouth daily.     Calcium Carbonate (CALCIUM 600 PO) Take by mouth.     cetirizine (ZYRTEC) 5 MG tablet Take 1 tablet (5 mg total) by mouth daily. 30 tablet 0   Cholecalciferol (VITAMIN D3) 2000  UNITS TABS Take by mouth.     COVID-19 mRNA bivalent vaccine, Pfizer, (PFIZER COVID-19 VAC BIVALENT) injection Inject into the muscle. 0.3 mL 0   fish oil-omega-3 fatty acids 1000 MG capsule Take 2 g by mouth daily.     Melatonin 3 MG CAPS Take by mouth.     Misc Natural Products (TART CHERRY ADVANCED PO) Take 2,500 Units by mouth daily.     pravastatin (PRAVACHOL) 10 MG tablet Take 10 mg by mouth daily.     rOPINIRole (REQUIP) 4 MG tablet Take by mouth.     thiamine (VITAMIN B-1) 100 MG tablet Take 100 mg by mouth daily.     TURMERIC PO Take by mouth.     vitamin B-12 (CYANOCOBALAMIN) 500 MCG tablet Take 500 mcg by mouth daily.     vitamin E 400 UNIT capsule Take 400 Units by mouth daily.     No facility-administered medications prior to visit.    Review of Systems  Constitutional:  Negative for chills, fever, malaise/fatigue and weight loss.  HENT:  Negative for hearing loss, sore throat and tinnitus.   Eyes:  Negative for blurred vision and double vision.  Respiratory:  Negative for cough, hemoptysis, sputum production, shortness of breath, wheezing and stridor.   Cardiovascular:  Negative for chest pain, palpitations, orthopnea, leg swelling and PND.  Gastrointestinal:  Negative for abdominal pain, constipation, diarrhea, heartburn, nausea and vomiting.  Genitourinary:  Negative for dysuria, hematuria and urgency.  Musculoskeletal:  Negative for joint pain and myalgias.  Skin:  Negative for itching and rash.  Neurological:  Negative for dizziness, tingling, weakness and headaches.  Endo/Heme/Allergies:  Negative for environmental allergies. Does not bruise/bleed easily.  Psychiatric/Behavioral:  Negative for depression. The patient is not nervous/anxious and does not have insomnia.   All other systems reviewed and are negative.    Objective:  Physical Exam Vitals reviewed.  Constitutional:      General: She is not in acute distress.    Appearance: She is well-developed. She  is obese.  HENT:     Head: Normocephalic and atraumatic.  Eyes:     General: No scleral icterus.    Conjunctiva/sclera: Conjunctivae normal.     Pupils: Pupils are equal, round, and reactive to light.  Neck:     Vascular: No JVD.     Trachea: No tracheal deviation.  Cardiovascular:     Rate and Rhythm: Normal rate and regular rhythm.     Heart sounds: Normal heart sounds. No murmur heard. Pulmonary:     Effort: Pulmonary effort is normal. No tachypnea, accessory muscle usage or respiratory distress.  Breath sounds: No stridor. No wheezing, rhonchi or rales.  Abdominal:     General: There is no distension.     Palpations: Abdomen is soft.     Tenderness: There is no abdominal tenderness.  Musculoskeletal:        General: No tenderness.     Cervical back: Neck supple.  Lymphadenopathy:     Cervical: No cervical adenopathy.  Skin:    General: Skin is warm and dry.     Capillary Refill: Capillary refill takes less than 2 seconds.     Findings: No rash.  Neurological:     Mental Status: She is alert and oriented to person, place, and time.  Psychiatric:        Behavior: Behavior normal.      Vitals:   05/19/22 1050  BP: 120/68  Pulse: 64  Temp: 98 F (36.7 C)  TempSrc: Oral  SpO2: 96%  Weight: 198 lb 6.4 oz (90 kg)  Height: 5\' 4"  (1.626 m)   96% on RA BMI Readings from Last 3 Encounters:  05/19/22 34.06 kg/m  01/18/22 33.64 kg/m  08/03/21 33.44 kg/m   Wt Readings from Last 3 Encounters:  05/19/22 198 lb 6.4 oz (90 kg)  01/18/22 196 lb (88.9 kg)  08/03/21 194 lb 12.8 oz (88.4 kg)     CBC    Component Value Date/Time   WBC 5.9 08/22/2007 0934   RBC 4.71 08/22/2007 0934   HGB 14.8 08/22/2007 0934   HCT 42.9 08/22/2007 0934   PLT 298 08/22/2007 0934   MCV 91.1 08/22/2007 0934   MCHC 34.5 08/22/2007 0934   RDW 12.3 08/22/2007 0934    Chest Imaging: October CT chest: Lung cancer screening CT 4B, new pulmonary nodule.  Nuclear medicine pet  imaging October 2022: No hypermetabolic uptake in the left lower lobe pulmonary nodule. Imaging reports reviewed in epic care everywhere, was completed at [facility].  CT chest 01/18/2022: Stable 11 x 13 mm pulmonary nodule. Possible small central area of fat consistent with a hamartoma. The patient's images have been independently reviewed by me.   . CT chest August 2023: Stable pulmonary nodule.  Central component of fat possibly consistent with a benign etiology such as a hamartoma. The patient's images have been independently reviewed by me.    Pulmonary Functions Testing Results:     No data to display          FeNO:   Pathology:   Echocardiogram:   Heart Catheterization:     Assessment & Plan:     ICD-10-CM   1. Lung nodule  R91.1 CT CHEST WO CONTRAST    2. Abnormal CT lung screening  R91.8     3. Former smoker  Z87.891       Discussion:  This is a 71 year old female, former smoker was enrolled in lung cancer screening program in [place].  She had a CT scan of the chest which revealed new lung nodule.  She was referred here for initial evaluation.  Initial PET scan imaging with no significant hypermetabolic uptake.  Follow-up CT imaging continues to show stability of the lung nodule with a possible central component of fat which would suggest a benign etiology such as hamartoma.  Plan: CT follow-up with no significant change. I think we can have a repeat CT scan in 12 months this would be due to her history of tobacco use. If this nodule is stable next year then she can reenroll in the low-dose lung cancer  screening program to start in 2025.  No additional follow-up for that specific nodule that is suggestive of a benign etiology would need additional follow-up. She is agreeable to this plan. Follow-up with [place] in 1 year after repeat noncontrasted CT chest.    Current Outpatient Medications:    Ascorbic Acid (VITAMIN C) 100 MG tablet, Take 100 mg by mouth  daily., Disp: , Rfl:    aspirin 81 MG tablet, Take 81 mg by mouth daily., Disp: , Rfl:    Calcium Carbonate (CALCIUM 600 PO), Take by mouth., Disp: , Rfl:    cetirizine (ZYRTEC) 5 MG tablet, Take 1 tablet (5 mg total) by mouth daily., Disp: 30 tablet, Rfl: 0   Cholecalciferol (VITAMIN D3) 2000 UNITS TABS, Take by mouth., Disp: , Rfl:    COVID-19 mRNA bivalent vaccine, Pfizer, (PFIZER COVID-19 VAC BIVALENT) injection, Inject into the muscle., Disp: 0.3 mL, Rfl: 0   fish oil-omega-3 fatty acids 1000 MG capsule, Take 2 g by mouth daily., Disp: , Rfl:    Melatonin 3 MG CAPS, Take by mouth., Disp: , Rfl:    Misc Natural Products (TART CHERRY ADVANCED PO), Take 2,500 Units by mouth daily., Disp: , Rfl:    pravastatin (PRAVACHOL) 10 MG tablet, Take 10 mg by mouth daily., Disp: , Rfl:    rOPINIRole (REQUIP) 4 MG tablet, Take by mouth., Disp: , Rfl:    thiamine (VITAMIN B-1) 100 MG tablet, Take 100 mg by mouth daily., Disp: , Rfl:    TURMERIC PO, Take by mouth., Disp: , Rfl:    vitamin B-12 (CYANOCOBALAMIN) 500 MCG tablet, Take 500 mcg by mouth daily., Disp: , Rfl:    vitamin E 400 UNIT capsule, Take 400 Units by mouth daily., Disp: , Rfl:    Josephine Igo, DO [facility] 05/19/2022 11:24 AM

## 2022-05-19 NOTE — Patient Instructions (Signed)
Thank you for visiting Dr. Tonia Brooms at [facility]. Today we recommend the following:  Orders Placed This Encounter  Procedures   CT CHEST WO CONTRAST   Follow up with [place] after your ct chest in 1 year   Return in about 1 year (around 05/20/2023) for with Kandice Robinsons, NP.    Please do your part to reduce the spread of COVID-19.

## 2022-07-02 ENCOUNTER — Other Ambulatory Visit (HOSPITAL_BASED_OUTPATIENT_CLINIC_OR_DEPARTMENT_OTHER): Payer: Self-pay

## 2022-07-02 MED ORDER — COVID-19 MRNA 2023-2024 VACCINE (COMIRNATY) 0.3 ML INJECTION
INTRAMUSCULAR | 0 refills | Status: AC
  Filled 2022-07-02: qty 0.3, 1d supply, fill #0

## 2022-07-09 ENCOUNTER — Other Ambulatory Visit (HOSPITAL_BASED_OUTPATIENT_CLINIC_OR_DEPARTMENT_OTHER): Payer: Self-pay

## 2022-07-09 MED ORDER — AREXVY 120 MCG/0.5ML IM SUSR
INTRAMUSCULAR | 0 refills | Status: AC
  Filled 2022-07-09: qty 0.5, 1d supply, fill #0

## 2022-08-09 ENCOUNTER — Ambulatory Visit (INDEPENDENT_AMBULATORY_CARE_PROVIDER_SITE_OTHER): Payer: Medicare Other

## 2022-08-09 ENCOUNTER — Ambulatory Visit: Payer: Medicare Other | Admitting: Podiatry

## 2022-08-09 DIAGNOSIS — M6788 Other specified disorders of synovium and tendon, other site: Secondary | ICD-10-CM | POA: Diagnosis not present

## 2022-08-09 DIAGNOSIS — M62462 Contracture of muscle, left lower leg: Secondary | ICD-10-CM | POA: Diagnosis not present

## 2022-08-09 MED ORDER — METHYLPREDNISOLONE 4 MG PO TBPK: ORAL_TABLET | ORAL | 0 refills | Status: DC

## 2022-08-09 NOTE — Patient Instructions (Signed)

## 2022-08-09 NOTE — Progress Notes (Signed)
  Subjective:  Patient ID: Margaret Casey, female    DOB: 1951/07/13,  MRN: [id number]  Chief Complaint  Patient presents with   Foot Pain    Left foot heel pain pt stated that it has been bothering her for about a month     71 y.o. female presents with the above complaint. History confirmed with patient.  She says that this has been going on for a year she been doing stretching and has not gone away.  Objective:  Physical Exam: warm, good capillary refill, no trophic changes or ulcerative lesions, normal DP and PT pulses, and normal sensory exam. Left Foot: tenderness at Achilles tendon insertion and gastrocnemius equinus is noted with a positive silverskiold test Right Foot: normal exam, no swelling, tenderness, instability; ligaments intact, full range of motion of all ankle/foot joints  No images are attached to the encounter.  Radiographs: Multiple views x-ray of the left foot: no fracture, dislocation, swelling or degenerative changes noted, plantar calcaneal spur, and posterior calcaneal spur Assessment:   1. Achilles tendinosis of left lower extremity   2. Gastrocnemius equinus of left lower extremity      Plan:  Patient was evaluated and treated and all questions answered.  Discussed the etiology and treatment options for Achilles tendinitis including stretching, formal physical therapy with an eccentric exercises therapy plan, supportive shoegears such as a running shoe or sneaker, heel lifts, topical and oral medications.  We also discussed that I do not routinely perform injections in this area because of the risk of an increased damage or rupture of the tendon.  We also discussed the role of surgical treatment of this for patients who do not improve after exhausting non-surgical treatment options.  -XR reviewed with patient -Educated on stretching and icing of the affected limb. -Referral placed to physical therapy. -Rx for Medrol 6-day taper. Advised on risks,  benefits, and alternatives of the medication -Recommended support and immobilization and offloading of the Achilles tendon with a cam walker boot.  This was dispensed today. -Referral sent to [facility]  Return in about 8 weeks (around 10/04/2022) for re-check Achilles tendon.

## 2022-08-18 IMAGING — MG MM DIGITAL SCREENING BILAT W/ TOMO AND CAD
6 of 10 series · 6 of 30 positions shown · non-contrast
Comparison: Previous exam(s).

CLINICAL DATA: Screening.

EXAM:
DIGITAL SCREENING BILATERAL MAMMOGRAM WITH TOMOSYNTHESIS AND CAD
TECHNIQUE: Bilateral screening digital craniocaudal and mediolateral oblique
mammograms were obtained. Bilateral screening digital breast
tomosynthesis was performed. The images were evaluated with
computer-aided detection.

[R CC synth-2D (1 of 2)]
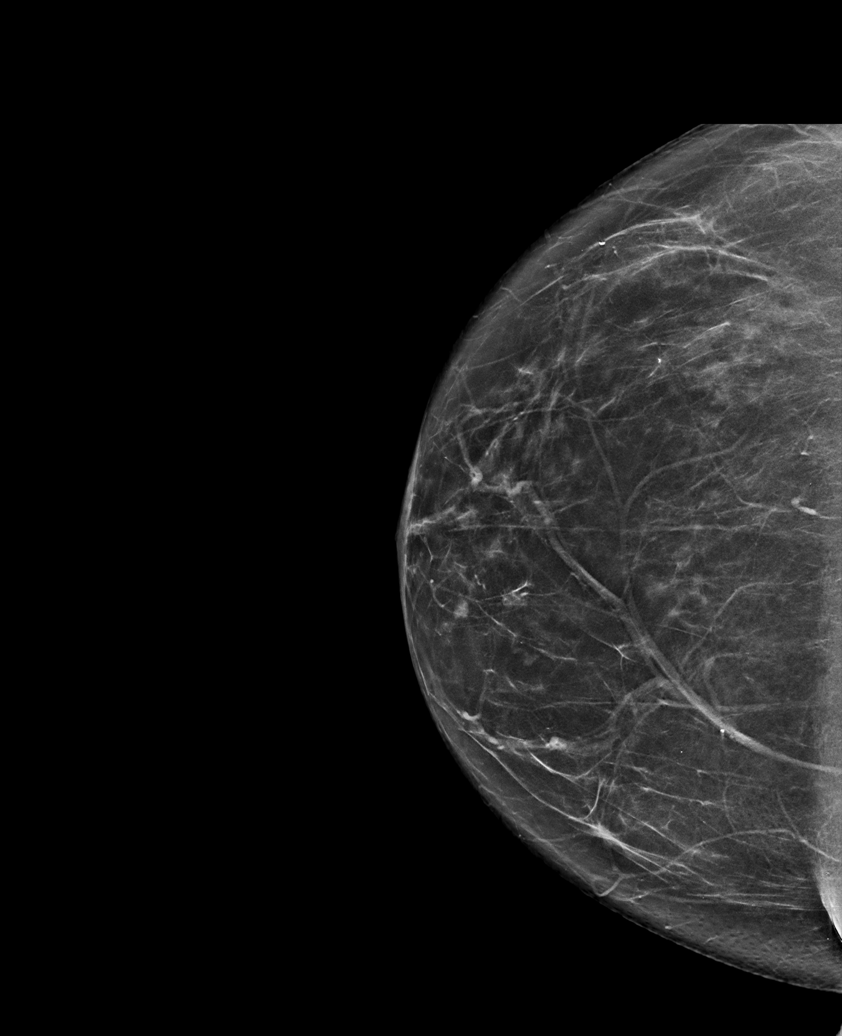

[R MLO synth-2D]
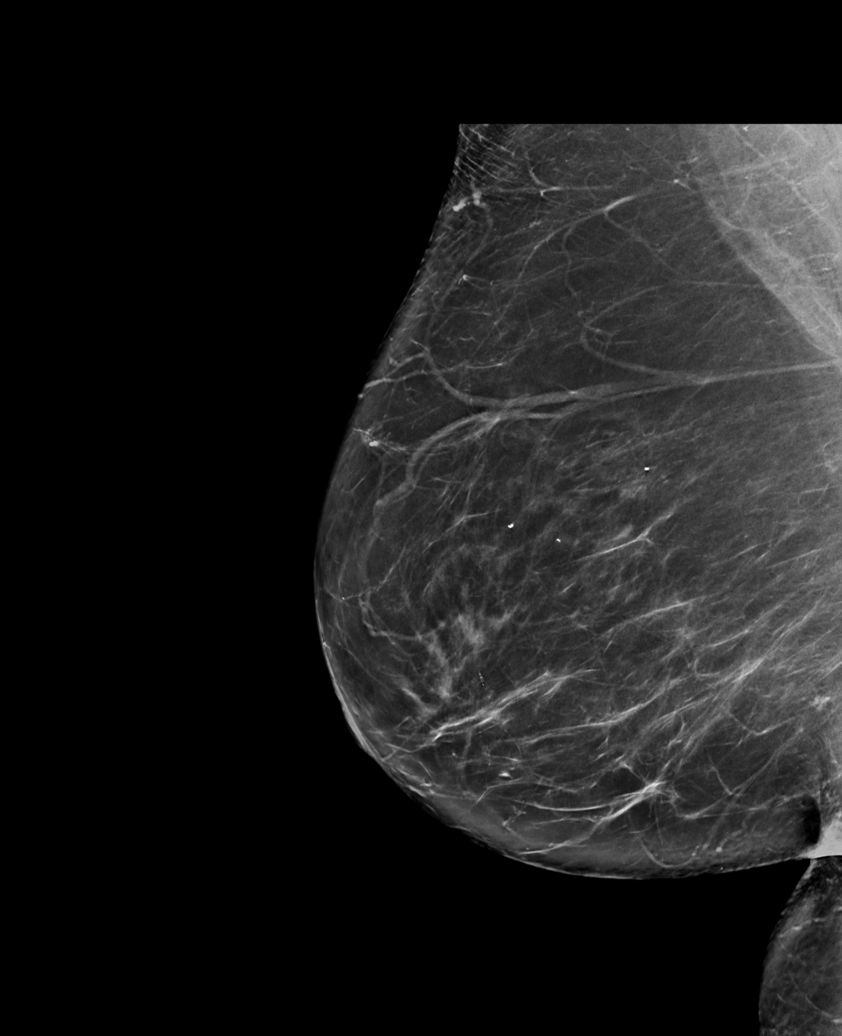

[R CC synth-2D (2 of 2)]
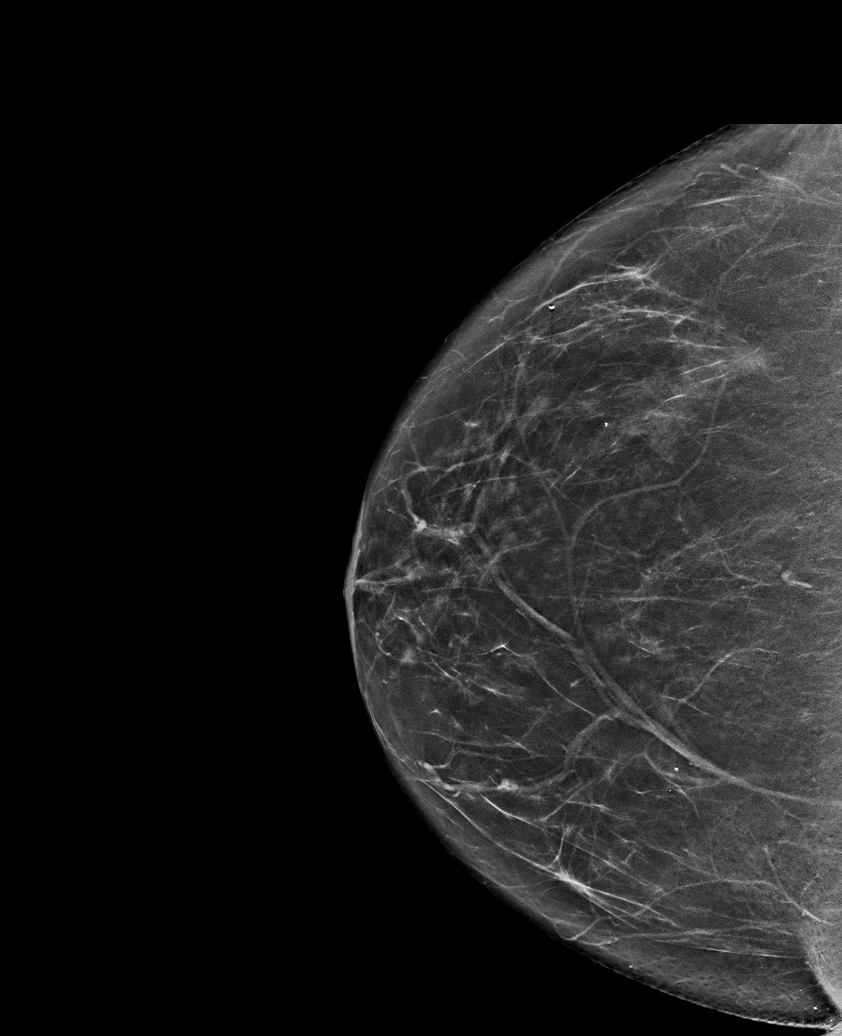

[L CC synth-2D]
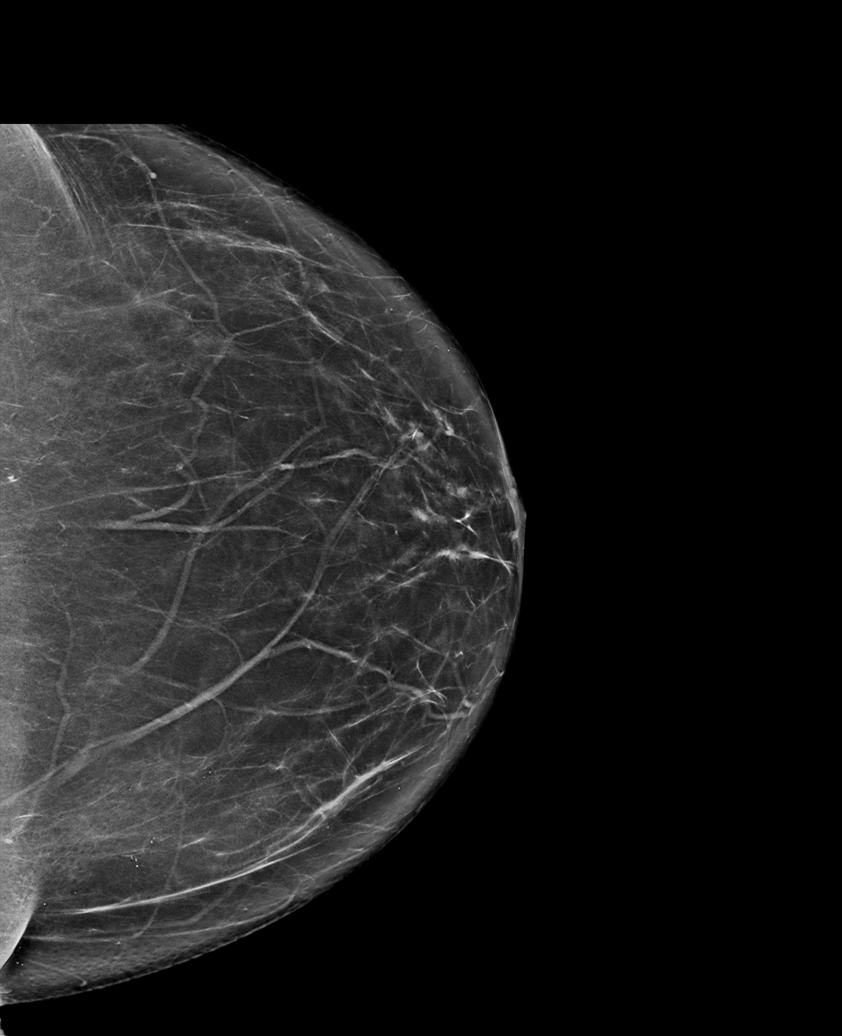

[L MLO synth-2D]
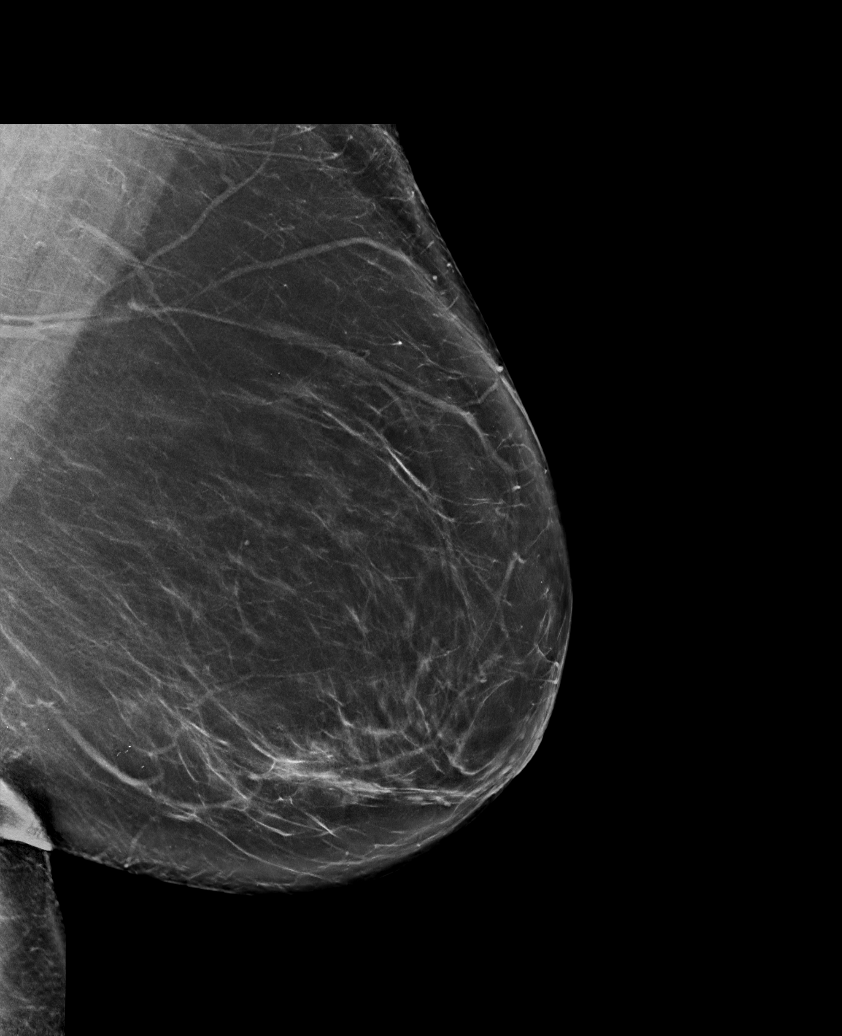

[R MLO tomo · tomo slice 43/85.0]
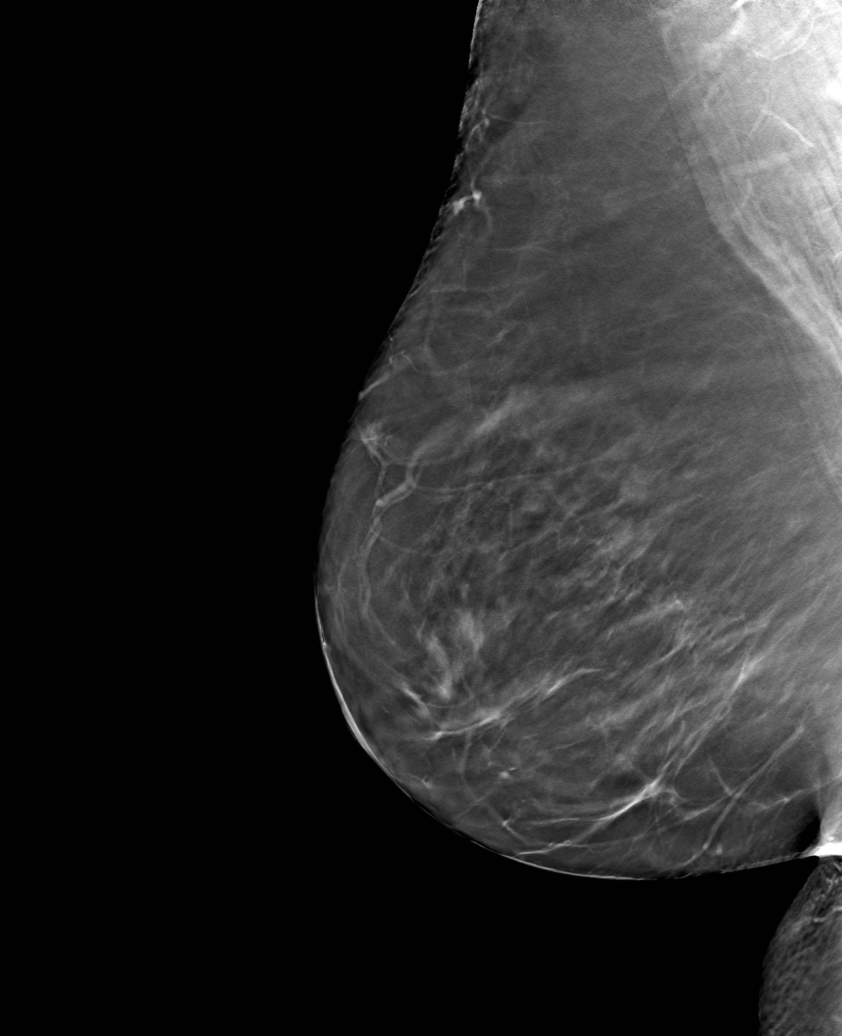

[6 of 30 positions shown; findings below may reference images not displayed]

ACR Breast Density Category b: There are scattered areas of
fibroglandular density.
FINDINGS: There are no findings suspicious for malignancy.
IMPRESSION: No mammographic evidence of malignancy. A result letter of this
screening mammogram will be mailed directly to the patient.

RECOMMENDATION:
Screening mammogram in one year. (Code:51-O-LD2)

BI-RADS CATEGORY  1: Negative.

## 2022-10-06 ENCOUNTER — Ambulatory Visit: Payer: Medicare Other | Admitting: Podiatry

## 2022-12-03 ENCOUNTER — Ambulatory Visit: Payer: Medicare Other | Admitting: Internal Medicine

## 2022-12-03 ENCOUNTER — Encounter: Payer: Self-pay | Admitting: Internal Medicine

## 2022-12-03 VITALS — BP 125/63 | HR 62 | Ht 64.0 in | Wt 195.0 lb

## 2022-12-03 DIAGNOSIS — R0609 Other forms of dyspnea: Secondary | ICD-10-CM

## 2022-12-03 DIAGNOSIS — R0600 Dyspnea, unspecified: Secondary | ICD-10-CM

## 2022-12-03 DIAGNOSIS — E782 Mixed hyperlipidemia: Secondary | ICD-10-CM

## 2022-12-03 NOTE — Progress Notes (Addendum)
Patient referred by Bernerd Limbo, MD for Dyspnea on exertion   Subjective:   Margaret Casey, female    DOB: [birth year], 72 y.o.   MRN: [id number]  Chief Complaint  Patient presents with   Shortness of Breath   New Patient (Initial Visit)    HPI  72 y.o.  female with PMH significant for but not limited to hyperlipidemia, PAD, and CKD III presents to the office to establish care and cardiac evaluation for Dyspnea on exertion.   Patient was last seen in 11/2022 at PCP office where she had been experiencing 4-6 weeks of increased SHOB with exertion. During this visit, she noted that she could only walk about a block before her symptoms started. She denied any chest pain or peripheral edema. Of note, patient had a CT scan (01/18/2022) that revealed new lung nodule, and follow up CT (05/18/2022) continues to show stability of the lung nodule with a possible central component of fat which would suggest a benign etiology such as hamartoma. On 01/18/2022 CT it reveled moderate scattered atherosclerotic calcifications mainly at the aortic arch.Three-vessel coronary artery calcifications and calcifications are also noted along the aortic valve. On 05/18/2022 CT is was also noted that she has aortic atherosclerosis and left coronary artery calcifications.   Today she endorses DOE. She has notices this began around 3months ago when she was walking around outside. She states onset of SHOB begins within 10-15min of walking and has even progressed to when she showers. She dose endorse smoking for 40-50 years and quit smoking around 5 months ago. She is retired and lives at home with her husband. Drinks 3 cups of coffee a day. She states bother her grandparent had heart attacks. She denies chest pain, palpitations, leg edema, orthopnea, PND, TIA/syncope.    Past Medical History:  Diagnosis Date   Allergy    Gout    Hyperlipidemia    Obesity    Restless leg syndrome      Past Surgical History:   Procedure Laterality Date   ABDOMINAL HYSTERECTOMY     CHOLECYSTECTOMY       Social History   Tobacco Use  Smoking Status Former   Packs/day: 1   Types: Cigarettes  Smokeless Tobacco Former   Quit date: 07/21/2022    Social History   Substance and Sexual Activity  Alcohol Use No     Family History  Problem Relation Age of Onset   Heart disease Father    Hypertension Father       Current Outpatient Medications:    Ascorbic Acid (VITAMIN C) 100 MG tablet, Take 100 mg by mouth daily., Disp: , Rfl:    aspirin 81 MG tablet, Take 81 mg by mouth daily., Disp: , Rfl:    Calcium Carbonate (CALCIUM 600 PO), Take by mouth., Disp: , Rfl:    Cholecalciferol (VITAMIN D3) 2000 UNITS TABS, Take by mouth., Disp: , Rfl:    colchicine 0.6 MG tablet, 2 tablets at first sign of gout. Then 1 tablet 1 hour later.  Take 1 pill twice daily starting the next day., Disp: , Rfl:    fish oil-omega-3 fatty acids 1000 MG capsule, Take 2 g by mouth daily., Disp: , Rfl:    Melatonin 3 MG CAPS, Take by mouth., Disp: , Rfl:    Misc Natural Products (TART CHERRY ADVANCED PO), Take 2,500 Units by mouth daily., Disp: , Rfl:    pravastatin (PRAVACHOL) 10 MG tablet, Take 10 mg by mouth daily., Disp: ,  Rfl:    prednisoLONE acetate (PRED FORTE) 1 % ophthalmic suspension, Place one drop into the right eye 4 (four) times daily., Disp: , Rfl:    rOPINIRole (REQUIP) 4 MG tablet, Take by mouth., Disp: , Rfl:    thiamine (VITAMIN B-1) 100 MG tablet, Take 100 mg by mouth daily., Disp: , Rfl:    TURMERIC PO, Take by mouth., Disp: , Rfl:    vitamin B-12 (CYANOCOBALAMIN) 500 MCG tablet, Take 500 mcg by mouth daily., Disp: , Rfl:    vitamin E 400 UNIT capsule, Take 400 Units by mouth daily., Disp: , Rfl:    COVID-19 mRNA bivalent vaccine, Pfizer, (PFIZER COVID-19 VAC BIVALENT) injection, Inject into the muscle., Disp: 0.3 mL, Rfl: 0   COVID-19 mRNA vaccine 2023-2024 (COMIRNATY) SUSP injection, Inject into the muscle.,  Disp: 0.3 mL, Rfl: 0   RSV vaccine recomb adjuvanted (AREXVY) 120 MCG/0.5ML injection, Inject into the muscle., Disp: 0.5 mL, Rfl: 0   Cardiovascular and other pertinent studies:  05/18/2022 Ct Chest Without Contrast IMPRESSION: 1. When measured with similar technique, smoothly marginated 1.3 x 1.3 cm infrahilar nodule of the superior segment left lower lobe is unchanged and again appears to demonstrate internal macroscopic fatty attenuation. 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 Given initial available examination in our system dated 01/18/2022 was for indication of follow-up pulmonary nodule, presumably this nodule has been seen by prior outside imaging, and if longer-term stability has been established this nodule can be more confidently determined benign. Otherwise, consider additional follow-up in 6-12 months to ensure long-term stability. Given size and central location relatively robust to motion artifact, PET-CT could also be considered to confidently exclude abnormal metabolic activity. 2. Emphysema. 3. Coronary artery disease.  01/18/2022 CT Chest Without Contrast IMPRESSION: 1. 13 x 11 mm left lower lobe pulmonary nodule appears to have some macroscopic fat in it and may represent a benign hamartoma. Recommend correlation with prior outside study. Options include PET-CT versus CT surveillance with repeat study in 4-6 months. 2. No mediastinal or hilar mass or adenopathy. 3. Mild underlying emphysematous changes and apical pleural and parenchymal scarring. 4. No acute pulmonary findings. 5. Aortic, Three-vessel coronary artery and aortic valve calcifications.  Reviewed external labs and tests, independently interpreted  EKG 12/03/2022: Sinus Rhythm, rate 62 bpm. Incomplete RBBB. T wave inversions in precordial leads.   Recent labs: 11/25/2022: H/H 14.2/42.2. MCV  91. Platelets 298 BNP 36.1   05/27/2022: Glucose 96, BUN/Cr 19/1.13. EGFR 52. Na/K 140/4.4. Rest of the CMP normal Chol 209, TG 155, HDL 77, LDL 106  Review of Systems  Constitutional: Negative.   Respiratory:  Positive for shortness of breath (dyspnea on exertion).   Cardiovascular:  Negative for chest pain, palpitations, orthopnea, claudication, leg swelling and PND.  Musculoskeletal: Negative.   Neurological: Negative.   Psychiatric/Behavioral: Negative.           Vitals:   12/03/22 1054  BP: 125/63  Pulse: 62  SpO2: 99%     Body mass index is 33.47 kg/m. Filed Weights   12/03/22 1054  Weight: 195 lb (88.5 kg)    Objective:    Physical Exam Vitals reviewed.  Cardiovascular:     Rate and Rhythm: Normal rate and regular rhythm.     Heart sounds: No murmur heard.    No gallop.  Pulmonary:     Effort: Pulmonary effort is normal.     Breath sounds: Normal  breath sounds.  Abdominal:     General: Bowel sounds are normal.     Palpations: Abdomen is soft.  Musculoskeletal:     Right lower leg: No edema.     Left lower leg: No edema.  Skin:    General: Skin is warm and dry.  Neurological:     General: No focal deficit present.     Mental Status: She is alert.  Psychiatric:        Mood and Affect: Mood normal.          Visit diagnoses:   ICD-10-CM   1. Dyspnea, unspecified type  R06.00 EKG 12-Lead    PCV ECHOCARDIOGRAM COMPLETE    PCV MYOCARDIAL PERFUSION WO LEXISCAN    2. DOE (dyspnea on exertion)  R06.09 PCV ECHOCARDIOGRAM COMPLETE    PCV MYOCARDIAL PERFUSION WO LEXISCAN    3. Mixed hyperlipidemia  E78.2 PCV ECHOCARDIOGRAM COMPLETE    PCV MYOCARDIAL PERFUSION WO LEXISCAN       Orders Placed This Encounter  Procedures   PCV MYOCARDIAL PERFUSION WO LEXISCAN   EKG 12-Lead   PCV ECHOCARDIOGRAM COMPLETE     Medication changes this visit: Medications Discontinued During This Encounter  Medication Reason   cetirizine (ZYRTEC) 5 MG tablet Patient  Preference   methylPREDNISolone (MEDROL DOSEPAK) 4 MG TBPK tablet Patient Preference   ketorolac (ACULAR) 0.5 % ophthalmic solution Patient Preference   gatifloxacin (ZYMAXID) 0.5 % SOLN Patient Preference   buPROPion (WELLBUTRIN XL) 150 MG 24 hr tablet Patient Preference   TART CHERRY PO Patient Preference    No orders of the defined types were placed in this encounter.    Assessment & Recommendations:   72 y.o.  female with PMH significant for but not limited to hyperlipidemia, PAD, and CKD III presents to the office to establish care and cardiac evaluation for Dyspnea on exertion.   Today she endorses DOE. She has notices this began around 31months ago when she was walking around outside. She states onset of SHOB begins within 10-15min of walking and has even progressed to when she showers. She dose endorse smoking for 40-50 years and quit smoking around 5 months ago. She is retired and lives at home with her husband. Drinks 3 cups of coffee a day. She states bother her grandparent had heart attacks. She denies chest pain, palpitations, leg edema, orthopnea, PND, TIA/syncope.  Dyspnea on Exertion  Plan - Schedule Cardiac Stress Test  - Schedule Echocardiogram  Mixed Lipidemia - Continue current medication - Follow up with PCP for lipid management   Thank you for referring the patient to [place]. Please feel free to contact with any questions.  Patient seen with Erma Heritage, NP-s. I independently reviewed the chart and examined the patient. I agree with assessment & plan as documented above. Please see my comments below.   Will obtain echo and stress test. Blood pressure very well controlled.    Floydene Flock, DO, [facility]  Pager: [phone] Office: [phone]

## 2022-12-10 ENCOUNTER — Ambulatory Visit: Payer: Medicare Other

## 2022-12-10 DIAGNOSIS — R0609 Other forms of dyspnea: Secondary | ICD-10-CM

## 2022-12-10 DIAGNOSIS — E782 Mixed hyperlipidemia: Secondary | ICD-10-CM

## 2022-12-10 DIAGNOSIS — R0600 Dyspnea, unspecified: Secondary | ICD-10-CM

## 2022-12-13 NOTE — Progress Notes (Signed)
Patient aware.

## 2022-12-13 NOTE — Progress Notes (Signed)
Called patient no answer left a vm

## 2022-12-13 NOTE — Progress Notes (Signed)
normal

## 2022-12-27 ENCOUNTER — Ambulatory Visit: Payer: Medicare Other

## 2022-12-27 DIAGNOSIS — E782 Mixed hyperlipidemia: Secondary | ICD-10-CM

## 2022-12-27 DIAGNOSIS — R0609 Other forms of dyspnea: Secondary | ICD-10-CM

## 2022-12-27 DIAGNOSIS — R0600 Dyspnea, unspecified: Secondary | ICD-10-CM

## 2022-12-28 NOTE — Progress Notes (Signed)
normal

## 2022-12-28 NOTE — Progress Notes (Signed)
Called patient husband answered and told him to left patient know we called

## 2022-12-28 NOTE — Progress Notes (Signed)
Patient called to inform her about her stress test being normal patient understood

## 2023-01-14 ENCOUNTER — Ambulatory Visit: Payer: Medicare Other | Admitting: Internal Medicine

## 2023-01-14 VITALS — BP 131/62 | HR 53 | Ht 64.0 in | Wt 192.0 lb

## 2023-01-14 DIAGNOSIS — R0609 Other forms of dyspnea: Secondary | ICD-10-CM

## 2023-01-14 DIAGNOSIS — E782 Mixed hyperlipidemia: Secondary | ICD-10-CM

## 2023-01-14 NOTE — Progress Notes (Signed)
Patient referred by Tracey Harries, MD for Dyspnea on exertion   Subjective:   Margaret Casey, female    DOB: [birth year], 72 y.o.   MRN: [id number]  Chief Complaint  Patient presents with   Shortness of Breath   Follow-up   Results    Shortness of Breath Pertinent negatives include no chest pain, claudication, leg swelling, orthopnea or PND.    72 y.o.  female with PMH significant for but not limited to hyperlipidemia, PAD, and CKD III presents to the office to establish care and cardiac evaluation for Dyspnea on exertion.   Patient is here for a follow-up visit. She has been feeling well. No concerns or complaints today. She is very happy that her cardiac testing came back normal. She would like to follow-up annually. Denies chest pain, shortness of breath, palpitations, diaphoresis, syncope, edema, PND, orthopnea.     Past Medical History:  Diagnosis Date   Allergy    Gout    Hyperlipidemia    Obesity    Restless leg syndrome      Past Surgical History:  Procedure Laterality Date   ABDOMINAL HYSTERECTOMY     CHOLECYSTECTOMY       Social History   Tobacco Use  Smoking Status Former   Packs/day: 1   Types: Cigarettes  Smokeless Tobacco Former   Quit date: 07/21/2022    Social History   Substance and Sexual Activity  Alcohol Use No     Family History  Problem Relation Age of Onset   Heart disease Father    Hypertension Father       Current Outpatient Medications:    Apoaequorin (PREVAGEN) 10 MG CAPS, Take by mouth., Disp: , Rfl:    Ascorbic Acid (VITAMIN C) 100 MG tablet, Take 100 mg by mouth daily., Disp: , Rfl:    aspirin 81 MG tablet, Take 81 mg by mouth daily., Disp: , Rfl:    Calcium Carbonate (CALCIUM 600 PO), Take by mouth., Disp: , Rfl:    Cholecalciferol (VITAMIN D3) 2000 UNITS TABS, Take by mouth., Disp: , Rfl:    colchicine 0.6 MG tablet, 2 tablets at first sign of gout. Then 1 tablet 1 hour later.  Take 1 pill twice daily starting  the next day., Disp: , Rfl:    fish oil-omega-3 fatty acids 1000 MG capsule, Take 2 g by mouth daily., Disp: , Rfl:    Melatonin 3 MG CAPS, Take by mouth., Disp: , Rfl:    Misc Natural Products (TART CHERRY ADVANCED PO), Take 2,500 Units by mouth daily., Disp: , Rfl:    pravastatin (PRAVACHOL) 10 MG tablet, Take 10 mg by mouth daily., Disp: , Rfl:    prednisoLONE acetate (PRED FORTE) 1 % ophthalmic suspension, Place one drop into the right eye 4 (four) times daily., Disp: , Rfl:    rOPINIRole (REQUIP) 4 MG tablet, Take by mouth., Disp: , Rfl:    RSV vaccine recomb adjuvanted (AREXVY) 120 MCG/0.5ML injection, Inject into the muscle., Disp: 0.5 mL, Rfl: 0   TURMERIC PO, Take by mouth., Disp: , Rfl:    vitamin E 400 UNIT capsule, Take 400 Units by mouth daily., Disp: , Rfl:    COVID-19 mRNA bivalent vaccine, Pfizer, (PFIZER COVID-19 VAC BIVALENT) injection, Inject into the muscle., Disp: 0.3 mL, Rfl: 0   COVID-19 mRNA vaccine 2023-2024 (COMIRNATY) SUSP injection, Inject into the muscle., Disp: 0.3 mL, Rfl: 0   Cardiovascular and other pertinent studies:  05/18/2022 Ct Chest Without Contrast  IMPRESSION: 1. When measured with similar technique, smoothly marginated 1.3 x 1.3 cm infrahilar nodule of the superior segment left lower lobe is unchanged and again appears to demonstrate internal macroscopic fatty attenuation. 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 Given initial available examination in our system dated 01/18/2022 was for indication of follow-up pulmonary nodule, presumably this nodule has been seen by prior outside imaging, and if longer-term stability has been established this nodule can be more confidently determined benign. Otherwise, consider additional follow-up in 6-12 months to ensure long-term stability. Given size and central location relatively robust to motion artifact,  PET-CT could also be considered to confidently exclude abnormal metabolic activity. 2. Emphysema. 3. Coronary artery disease.  01/18/2022 CT Chest Without Contrast IMPRESSION: 1. 13 x 11 mm left lower lobe pulmonary nodule appears to have some macroscopic fat in it and may represent a benign hamartoma. Recommend correlation with prior outside study. Options include PET-CT versus CT surveillance with repeat study in 4-6 months. 2. No mediastinal or hilar mass or adenopathy. 3. Mild underlying emphysematous changes and apical pleural and parenchymal scarring. 4. No acute pulmonary findings. 5. Aortic, Three-vessel coronary artery and aortic valve calcifications.  Reviewed external labs and tests, independently interpreted  EKG 12/03/2022: Sinus Rhythm, rate 62 bpm. Incomplete RBBB. T wave inversions in precordial leads.   Regadenoson (with Mod Bruce protocol) Nuclear stress test 12/27/2022 Myocardial perfusion is normal. Overall LV systolic function is normal without regional wall motion abnormalities. Stress LV EF: 64%. Low risk study, Nondiagnostic ECG stress. The heart rate response was consistent with Regadenoson. The blood pressure response was physiologic. No previous exam available for comparison.  Echocardiogram 12/10/2022: Left ventricle cavity is normal in size and wall thickness. Normal global wall motion. Normal LV systolic function with EF 64%. Normal diastolic filling pattern. No significant valvular abnormality. No evidence of pulmonary hypertension.    Recent labs: 11/25/2022: H/H 14.2/42.2. MCV 91. Platelets 298 BNP 36.1   05/27/2022: Glucose 96, BUN/Cr 19/1.13. EGFR 52. Na/K 140/4.4. Rest of the CMP normal Chol 209, TG 155, HDL 77, LDL 106  Review of Systems  Constitutional: Negative.   Respiratory:  Negative for shortness of breath.   Cardiovascular:  Negative for chest pain, palpitations, orthopnea, claudication, leg swelling and PND.  Musculoskeletal:  Negative.   Neurological: Negative.   Psychiatric/Behavioral: Negative.           There were no vitals filed for this visit.    Body mass index is 32.96 kg/m. Filed Weights   01/14/23 1017  Weight: 192 lb (87.1 kg)    Objective:    Physical Exam Vitals reviewed.  Cardiovascular:     Rate and Rhythm: Normal rate and regular rhythm.     Heart sounds: No murmur heard.    No gallop.  Pulmonary:     Effort: Pulmonary effort is normal.     Breath sounds: Normal breath sounds.  Abdominal:     General: Bowel sounds are normal.     Palpations: Abdomen is soft.  Musculoskeletal:     Right lower leg: No edema.     Left lower leg: No edema.  Skin:    General: Skin is warm and dry.  Neurological:     General: No focal deficit present.     Mental Status: She is alert.  Psychiatric:        Mood and Affect: Mood normal.  Visit diagnoses: No diagnosis found.    No orders of the defined types were placed in this encounter.    Medication changes this visit: Medications Discontinued During This Encounter  Medication Reason   vitamin B-12 (CYANOCOBALAMIN) 500 MCG tablet Completed Course   thiamine (VITAMIN B-1) 100 MG tablet Completed Course    No orders of the defined types were placed in this encounter.    Assessment & Recommendations:   71 y.o.  female with PMH significant for but not limited to hyperlipidemia, PAD, and CKD III presents to the office to establish care and cardiac evaluation for Dyspnea on exertion.    Dyspnea on Exertion - now resolved Plan - discussed results of stress test and echocardiogram No evidence of ischemia or VHD  Mixed Lipidemia - Continue current medication - Follow up with PCP for lipid management       Clotilde Dieter, DO  Pager: [phone] Office: [phone]

## 2023-05-16 ENCOUNTER — Ambulatory Visit (HOSPITAL_BASED_OUTPATIENT_CLINIC_OR_DEPARTMENT_OTHER): Admission: RE | Admit: 2023-05-16 | Payer: Medicare Other | Source: Ambulatory Visit

## 2023-05-31 ENCOUNTER — Telehealth: Payer: Self-pay | Admitting: Pulmonary Disease

## 2023-05-31 NOTE — Telephone Encounter (Signed)
Called patient to schedule her yearly f/u with Dr Tonia Brooms. She informed me that she is now seeing a new doctor and declined to schedule. Nothing further needed.

## 2023-06-03 ENCOUNTER — Other Ambulatory Visit (HOSPITAL_BASED_OUTPATIENT_CLINIC_OR_DEPARTMENT_OTHER): Payer: Self-pay

## 2023-06-03 MED ORDER — COMIRNATY 30 MCG/0.3ML IM SUSY
0.3000 mL | PREFILLED_SYRINGE | Freq: Once | INTRAMUSCULAR | 0 refills | Status: AC
  Filled 2023-06-03: qty 0.3, 1d supply, fill #0

## 2024-01-13 ENCOUNTER — Ambulatory Visit: Payer: Self-pay | Admitting: Cardiology

## 2024-07-03 ENCOUNTER — Other Ambulatory Visit (HOSPITAL_BASED_OUTPATIENT_CLINIC_OR_DEPARTMENT_OTHER): Payer: Self-pay

## 2024-07-03 MED ORDER — COMIRNATY 30 MCG/0.3ML IM SUSY
0.3000 mL | PREFILLED_SYRINGE | Freq: Once | INTRAMUSCULAR | 0 refills | Status: AC
  Filled 2024-07-03: qty 0.3, 1d supply, fill #0
